# Patient Record
Sex: Female | Born: 1985 | Race: Black or African American | Hispanic: No | Marital: Single
Health system: Midwestern US, Community
[De-identification: ages and names within clinical notes are randomized; demographics above are authoritative.]

## PROBLEM LIST (undated history)

## (undated) DIAGNOSIS — D069 Carcinoma in situ of cervix, unspecified: Secondary | ICD-10-CM

## (undated) DIAGNOSIS — F32A Depression, unspecified: Secondary | ICD-10-CM

## (undated) DIAGNOSIS — Z9889 Other specified postprocedural states: Secondary | ICD-10-CM

## (undated) DIAGNOSIS — F329 Major depressive disorder, single episode, unspecified: Secondary | ICD-10-CM

## (undated) DIAGNOSIS — IMO0001 Reserved for inherently not codable concepts without codable children: Secondary | ICD-10-CM

## (undated) DIAGNOSIS — F419 Anxiety disorder, unspecified: Secondary | ICD-10-CM

## (undated) DIAGNOSIS — O039 Complete or unspecified spontaneous abortion without complication: Secondary | ICD-10-CM

## (undated) HISTORY — PX: CERVICAL CONE BIOPSY: SUR198

---

## 2008-01-22 LAB — STD PROFILE
Chlamydia amplified: NEGATIVE
HIV 1/2 Ab screen: NEGATIVE
Hep B surface Ag Interp.: NEGATIVE
Hep C virus Ab Interp.: NEGATIVE
Hepatitis B surface Ag: <0.1 Index (ref <1.00)
Hepatitis C virus Ab: 0.02 Index (ref <0.80)
N. gonorrhea, amplified: NEGATIVE
RPR: NONREACTIVE

## 2009-05-20 LAB — METABOLIC PANEL, BASIC
Anion gap: 9 mmol/L (ref 5–15)
BUN/Creatinine ratio: 10 — ABNORMAL LOW (ref 12–20)
BUN: 9 MG/DL (ref 7–18)
CO2: 25 MMOL/L (ref 21–32)
Calcium: 9.5 MG/DL (ref 8.4–10.4)
Chloride: 101 MMOL/L (ref 100–108)
Creatinine: 0.9 MG/DL (ref 0.6–1.3)
GFR est AA: >60 mL/min/{1.73_m2} (ref >60)
GFR est non-AA: >60 mL/min/{1.73_m2} (ref >60)
Glucose: 80 MG/DL (ref 74–99)
Potassium: 3.7 MMOL/L (ref 3.5–5.5)
Sodium: 135 MMOL/L — ABNORMAL LOW (ref 136–145)

## 2009-06-09 LAB — BETA HCG, QT
Beta HCG, QT: 422195 m[IU]/mL — ABNORMAL HIGH (ref 0–10)
hCG Quant: 422195 m[IU]/mL — ABNORMAL HIGH (ref 0–10)

## 2009-06-09 LAB — URINALYSIS W/ RFLX MICROSCOPIC
Bilirubin: NEGATIVE
Glucose: NEGATIVE MG/DL
Ketone: >80 MG/DL — AB
Leukocyte Esterase: NEGATIVE
Nitrites: NEGATIVE
Protein: 30 MG/DL — AB
Specific gravity: >1.03 — ABNORMAL HIGH (ref 1.003–1.030)
Urobilinogen: 1 EU/DL (ref 0.2–1.0)
pH (UA): 6 (ref 5.0–8.0)

## 2009-06-09 LAB — URINE MICROSCOPIC ONLY
RBC: 0 /HPF (ref 0–5)
WBC: NEGATIVE /HPF (ref 0–4)

## 2009-06-09 LAB — METABOLIC PANEL, BASIC
Anion gap: 13 mmol/L (ref 5–15)
BUN/Creatinine ratio: 13 (ref 12–20)
BUN: 10 MG/DL (ref 7–18)
CO2: 23 MMOL/L (ref 21–32)
Calcium: 9.6 MG/DL (ref 8.4–10.4)
Chloride: 98 MMOL/L — ABNORMAL LOW (ref 100–108)
Creatinine: 0.8 MG/DL (ref 0.6–1.3)
GFR est AA: >60 mL/min/{1.73_m2} (ref >60)
GFR est non-AA: >60 mL/min/{1.73_m2} (ref >60)
Glucose: 75 MG/DL (ref 74–99)
Potassium: 3.4 MMOL/L — ABNORMAL LOW (ref 3.5–5.5)
Sodium: 134 MMOL/L — ABNORMAL LOW (ref 136–145)

## 2009-06-09 LAB — CBC WITH AUTOMATED DIFF
ABS. BASOPHILS: 0 10*3/uL (ref 0.0–0.06)
ABS. EOSINOPHILS: 0.1 10*3/uL (ref 0.0–0.4)
ABS. LYMPHOCYTES: 1.4 10*3/uL (ref 0.9–3.6)
ABS. MONOCYTES: 0.3 10*3/uL (ref 0.05–1.2)
ABS. NEUTROPHILS: 5.5 10*3/uL (ref 1.8–8.0)
BASOPHILS: 1 % (ref 0–2)
EOSINOPHILS: 2 % (ref 0–5)
HCT: 35 % (ref 35.0–45.0)
HGB: 12.2 g/dL (ref 12.0–16.0)
LYMPHOCYTES: 18 % — ABNORMAL LOW (ref 21–52)
MCH: 31 PG (ref 24.0–34.0)
MCHC: 34.9 g/dL (ref 31.0–37.0)
MCV: 89.1 FL (ref 74.0–97.0)
MONOCYTES: 4 % (ref 3–10)
MPV: 9.5 FL (ref 9.2–11.8)
NEUTROPHILS: 75 % — ABNORMAL HIGH (ref 40–73)
PLATELET: 299 10*3/uL (ref 135–420)
RBC: 3.93 M/uL — ABNORMAL LOW (ref 4.20–5.30)
RDW: 11.8 % (ref 11.6–14.5)
WBC: 7.4 10*3/uL (ref 4.6–13.2)

## 2009-06-21 LAB — URINALYSIS W/ RFLX MICROSCOPIC
Bilirubin: NEGATIVE
Blood: NEGATIVE
Glucose: NEGATIVE MG/DL
Ketone: >80 MG/DL — AB
Leukocyte Esterase: NEGATIVE
Nitrites: NEGATIVE
Protein: 100 MG/DL — AB
Specific gravity: >1.03 — ABNORMAL HIGH (ref 1.003–1.030)
Urobilinogen: 0.2 EU/DL (ref 0.2–1.0)
pH (UA): 6 (ref 5.0–8.0)

## 2009-06-21 LAB — METABOLIC PANEL, BASIC
Anion gap: 13 mmol/L (ref 5–15)
BUN/Creatinine ratio: 13 (ref 12–20)
BUN: 12 MG/DL (ref 7–18)
CO2: 24 MMOL/L (ref 21–32)
Calcium: 9.7 MG/DL (ref 8.4–10.4)
Chloride: 101 MMOL/L (ref 100–108)
Creatinine: 0.9 MG/DL (ref 0.6–1.3)
GFR est AA: >60 mL/min/{1.73_m2} (ref >60)
GFR est non-AA: >60 mL/min/{1.73_m2} (ref >60)
Glucose: 74 MG/DL (ref 74–99)
Potassium: 3.5 MMOL/L (ref 3.5–5.5)
Sodium: 138 MMOL/L (ref 136–145)

## 2009-06-21 LAB — CBC WITH AUTOMATED DIFF
ABS. BASOPHILS: 0 10*3/uL (ref 0.0–0.06)
ABS. EOSINOPHILS: 0.2 10*3/uL (ref 0.0–0.4)
ABS. LYMPHOCYTES: 1.3 10*3/uL (ref 0.9–3.6)
ABS. MONOCYTES: 0.3 10*3/uL (ref 0.05–1.2)
ABS. NEUTROPHILS: 5.6 10*3/uL (ref 1.8–8.0)
BASOPHILS: 0 % (ref 0–2)
EOSINOPHILS: 2 % (ref 0–5)
HCT: 36.6 % (ref 35.0–45.0)
HGB: 12.6 g/dL (ref 12.0–16.0)
LYMPHOCYTES: 18 % — ABNORMAL LOW (ref 21–52)
MCH: 30.6 PG (ref 24.0–34.0)
MCHC: 34.4 g/dL (ref 31.0–37.0)
MCV: 88.8 FL (ref 74.0–97.0)
MONOCYTES: 4 % (ref 3–10)
MPV: 9.4 FL (ref 9.2–11.8)
NEUTROPHILS: 76 % — ABNORMAL HIGH (ref 40–73)
PLATELET: 294 10*3/uL (ref 135–420)
RBC: 4.12 M/uL — ABNORMAL LOW (ref 4.20–5.30)
RDW: 11.8 % (ref 11.6–14.5)
WBC: 7.4 10*3/uL (ref 4.6–13.2)

## 2009-06-21 LAB — URINE MICROSCOPIC ONLY
RBC: 0 /HPF (ref 0–5)
WBC: 0 /HPF (ref 0–4)

## 2009-07-16 LAB — POC CHEM8
Anion gap, POC: 16 (ref 10–20)
BUN, POC: 4 MG/DL — ABNORMAL LOW (ref 7–18)
CO2, POC: 21 MMOL/L (ref 19–24)
Calcium, ionized (POC): 1.19 MMOL/L (ref 1.12–1.32)
Chloride, POC: 105 MMOL/L (ref 100–108)
Creatinine, POC: 0.7 MG/DL (ref 0.6–1.3)
GFRAA, POC: >60 mL/min/{1.73_m2} (ref >60)
GFRNA, POC: >60 mL/min/{1.73_m2} (ref >60)
Glucose, POC: 65 MG/DL — ABNORMAL LOW (ref 74–106)
Hematocrit, POC: 29 % — ABNORMAL LOW (ref 36.0–46.0)
Hemoglobin, POC: 9.9 G/DL — ABNORMAL LOW (ref 12.0–16.0)
Potassium, POC: 3.6 MMOL/L (ref 3.5–5.5)
Sodium, POC: 139 MMOL/L (ref 136–145)

## 2009-07-16 LAB — URINE MICROSCOPIC ONLY
Bacteria: NEGATIVE /HPF
RBC: 0 /HPF (ref 0–5)
WBC: 0 /HPF (ref 0–4)

## 2009-07-16 LAB — URINALYSIS W/ RFLX MICROSCOPIC
Bilirubin: NEGATIVE
Blood: NEGATIVE
Glucose: NEGATIVE MG/DL
Ketone: >80 MG/DL — AB
Leukocyte Esterase: NEGATIVE
Nitrites: NEGATIVE
Protein: 30 MG/DL — AB
Specific gravity: >1.03 — ABNORMAL HIGH (ref 1.003–1.030)
Urobilinogen: 2 EU/DL — ABNORMAL HIGH (ref 0.2–1.0)
pH (UA): 6.5 (ref 5.0–8.0)

## 2009-07-16 LAB — GLUCOSE, POC: Glucose (POC): 79 mg/dL (ref 70–110)

## 2009-12-15 LAB — POC URINE MACROSCOPIC
Bilirubin (POC): NEGATIVE
Blood (POC): NEGATIVE
Glucose, urine (POC): NEGATIVE mg/dL
Glucose, urine (POC): NEGATIVE mg/dL
Ketones (POC): 40 mg/dL — AB
Nitrite (POC): NEGATIVE
Nitrite (POC): NEGATIVE
Protein (POC): 100 mg/dL — AB
Spec. gravity (POC): 1.02 (ref 1.003–1.030)
Spec. gravity (POC): 1.02 (ref 1.003–1.030)
Urobilinogen (POC): 8 EU/dL — ABNORMAL HIGH (ref 0.2–1.0)
pH, urine  (POC): 7.5 (ref 5.0–8.0)
pH, urine  (POC): 7.5 (ref 5.0–8.0)

## 2010-01-04 LAB — TYPE AND SCREEN
ABO/Rh: A POS
Antibody Screen: NEGATIVE

## 2010-01-04 LAB — CBC WITH AUTOMATED DIFF
ABS. BASOPHILS: 0 10*3/uL (ref 0.0–0.06)
ABS. EOSINOPHILS: 0.2 10*3/uL (ref 0.0–0.4)
ABS. LYMPHOCYTES: 1.5 10*3/uL (ref 0.9–3.6)
ABS. MONOCYTES: 1.1 10*3/uL (ref 0.05–1.2)
ABS. NEUTROPHILS: 8 10*3/uL (ref 1.8–8.0)
BASOPHILS: 0 % (ref 0–2)
EOSINOPHILS: 2 % (ref 0–5)
HCT: 27.8 % — ABNORMAL LOW (ref 35.0–45.0)
HGB: 9.1 g/dL — ABNORMAL LOW (ref 12.0–16.0)
LYMPHOCYTES: 14 % — ABNORMAL LOW (ref 21–52)
MCH: 28.8 PG (ref 24.0–34.0)
MCHC: 32.7 g/dL (ref 31.0–37.0)
MCV: 88 FL (ref 74.0–97.0)
MONOCYTES: 10 % (ref 3–10)
MPV: 10 FL (ref 9.2–11.8)
NEUTROPHILS: 74 % — ABNORMAL HIGH (ref 40–73)
PLATELET: 208 10*3/uL (ref 135–420)
RBC: 3.16 M/uL — ABNORMAL LOW (ref 4.20–5.30)
RDW: 13.8 % (ref 11.6–14.5)
WBC: 10.7 10*3/uL (ref 4.6–13.2)

## 2010-01-04 LAB — TYPE & SCREEN
ABO/Rh(D): A POS
Antibody screen: NEGATIVE

## 2010-01-05 LAB — HEMOGLOBIN: HGB: 8.9 g/dL — ABNORMAL LOW (ref 12.0–16.0)

## 2010-01-05 LAB — HEMATOCRIT: HCT: 27.4 % — ABNORMAL LOW (ref 35.0–45.0)

## 2010-04-18 LAB — URINALYSIS W/ RFLX MICROSCOPIC
Bilirubin: NEGATIVE
Glucose: NEGATIVE MG/DL
Ketone: NEGATIVE MG/DL
Leukocyte Esterase: NEGATIVE
Nitrites: NEGATIVE
Protein: NEGATIVE MG/DL
Specific gravity: >1.03 — ABNORMAL HIGH (ref 1.003–1.030)
Urobilinogen: 0.2 EU/DL (ref 0.2–1.0)
pH (UA): 5.5 (ref 5.0–8.0)

## 2010-04-18 LAB — HCG URINE, QL: HCG urine, QL: NEGATIVE

## 2010-04-18 LAB — URINE MICROSCOPIC ONLY
RBC: 0 /HPF (ref 0–5)
WBC: NEGATIVE /HPF (ref 0–4)

## 2010-04-19 LAB — CHLAMYDIA/GC PCR
Chlamydia amplified: NEGATIVE
N. gonorrhea, amplified: NEGATIVE

## 2010-04-27 LAB — HGB & HCT
HCT: 34.5 % — ABNORMAL LOW (ref 35.0–45.0)
HGB: 11.6 g/dL — ABNORMAL LOW (ref 12.0–16.0)

## 2010-04-28 LAB — TYPE AND SCREEN
ABO/Rh: A POS
Antibody Screen: NEGATIVE

## 2010-04-28 LAB — TYPE & SCREEN
ABO/Rh(D): A POS
Antibody screen: NEGATIVE

## 2010-05-01 NOTE — H&P (Unsigned)
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    HISTORY OF PRESENT ILLNESS: The patient is a 25 year old female, gravida 3,  para 2, AB 1, who was noted in June 2011 to have a Pap smear consistent  with high-grade squamous intraepithelial neoplasia. Colposcopy was  performed in August 2011, but no biopsy was obtained due to pregnancy. Pap  smear in January 2012 was interpreted as atypical squamous cells  of undetermined significance with high-grade features and a  colposcopic-directed biopsy in March 2012 revealed cervical intraepithelial  neoplasia grade 2. Management options were discussed with the patient and  LEEP colonization was agreed upon. Due to patient apprehension about an  office-based procedure, she is scheduled in the main operating room with  anesthesia.    PAST MEDICAL HISTORY: Otherwise, unremarkable. She has had 2 term  pregnancies with vaginal deliveries and 1 miscarriage.    SURGICAL HISTORY: Unremarkable.    REVIEW OF SYSTEMS: Noncontributory.    FAMILY MEDICAL HISTORY: Also, noncontributory.    PHYSICAL EXAMINATION  GENERAL: Well-developed and well-nourished female, in no acute distress.  VITAL SIGNS: Her blood pressure is 102/70. Weight is 134 pounds.  HEAD/EARS/EYES/NOSE/THROAT: Within normal limits.  LUNGS: Clear.  CARDIOVASCULAR: Regular rate and rhythm without S3, S4 or murmur.  ABDOMEN: Soft and nontender.  EXTREMITIES: Normal.  NEUROLOGICAL: Intact.  PELVIC: Normal female external genitalia. The vault is clear. The cervix   parous. The uterus is normal, size, shape and consistency. The adnexa are  normal.    IMPRESSION: Cervical intraepithelial neoplasia grade 2.    PLAN: Admit for LEEP colonization. The procedure was discussed in detail  the patient. She understands the indications for surgery. She also  understands and accepts the risks of surgery to include bleeding, possibly  requiring blood transfusion; infection; injury to the cervix, uterus and  other organs requiring further surgery to correct. She also understands  that despite the procedure, there may be a persistence or recurrence of the  disease process requiring further treatment. Opportunity was afforded for  questions, which were answered, and the patient consented to surgery as  discussed.                            Date:______Time:______Signature________________________________              Genoveva Ill, MD    HDW:wmx  D: 05/01/2010  6:27 P T: 05/01/2010  6:54 P  Job#:  [id number]  CScriptDoc #:  [id number]  cc:   Lejon Afzal DIXON Emrey Thornley,  MD

## 2010-05-02 NOTE — Op Note (Unsigned)
[facility]                  [address], [place], [place]  [address]                                 OPERATIVE REPORT    PATIENT:     Tiffany, Hernandez  MRN              [id number]   DATE:       05/02/2010  BILLING:         [id number]  LOCATION:  ATTENDING:   Genoveva Ill, MD  SURGEON:     Genoveva Ill, MD    AMENDED  PREOPERATIVE DIAGNOSES: Cervical intraepithelial neoplasia, grade 2.    POSTOPERATIVE DIAGNOSES: Cervical intraepithelial neoplasia, grade 2.    PROCEDURE PERFORMED: Loop electrode excision procedure conization.    SURGEON: Milessa Hogan Camillia Herter, MD.    FIRST ASSISTANT: Delice Bison.    ANESTHESIA: [place] [facility], general LMA.    The remainder of the intraoperative written summary has been reviewed and  is correct.    INDICATIONS: The patient is a 25 year old female, gravida 3, para 2, AB 1  with CIN 2 on colposcopic directed biopsy. Additional information  concerning indications for surgery is contained in the previously dictated  history and physical examination report.    DESCRIPTION OF PROCEDURE: In the operating room, adequate anesthesia was  achieved with a general LMA technique. The patient was carefully placed in  candy-cane stirrups in the usual manner for vaginal surgery. Care was taken  to avoid any undue flexion or points of pressure. A nonconducting speculum  was introduced into the vagina and the cervix was bathed with acetic acid.  The cervix was then infiltrated with 1% lidocaine 10 mL and then utilizing  operating loops and a head lamp to facilitate visualization, a LEEP  conization was performed utilizing a 20 mm loop. The excision was carried  from the 8 o'clock on the cervix to 2 o'clock on the cervix. The specimen  was removed in toto. It was later tagged at 9 o'clock and cut at 6 o'clock.  Spray fulguration was utilized for hemostasis of the cone bed and this was   then augmented with a ferric sulfate solution. The estimated blood loss was  minimal. Hemostasis was excellent at the completion of the case with the  nonconducting speculum was removed. The patient tolerated the procedure  well. Final sponge and needle count was correct. She went to the recovery  room in satisfactory condition.                             Date:______Time:______Signature________________________________                                     Genoveva Ill, MD    HDW:wmx  D: 05/02/2010  2:23 P T: 05/02/2010  3:02 P  Job#:  [id number]  CScriptDoc #:  [id number]  cc:   Genoveva Ill, MD

## 2010-05-03 LAB — HCG URINE, QL. - POC: Pregnancy test,urine (POC): NEGATIVE

## 2010-07-22 LAB — POC CHEM8
Anion gap, POC: 14 (ref 10–20)
BUN, POC: 9 MG/DL (ref 7–18)
CO2, POC: 23 MMOL/L (ref 19–24)
Calcium, ionized (POC): 1.26 MMOL/L (ref 1.12–1.32)
Chloride, POC: 104 MMOL/L (ref 100–108)
Creatinine, POC: 0.7 MG/DL (ref 0.6–1.3)
GFRAA, POC: >60 mL/min/{1.73_m2} (ref >60)
GFRNA, POC: >60 mL/min/{1.73_m2} (ref >60)
Glucose, POC: 73 MG/DL — ABNORMAL LOW (ref 74–106)
Hematocrit, POC: 38 % (ref 36.0–46.0)
Hemoglobin, POC: 12.9 G/DL (ref 12.0–16.0)
Potassium, POC: 3.7 MMOL/L (ref 3.5–5.5)
Sodium, POC: 137 MMOL/L (ref 136–145)

## 2010-07-22 LAB — URINALYSIS W/ RFLX MICROSCOPIC
Bilirubin UA, confirm: NEGATIVE
Bilirubin: NEGATIVE
Blood: NEGATIVE
Glucose: NEGATIVE MG/DL
Ketone: 40 MG/DL — AB
Nitrites: NEGATIVE
Specific gravity: 1.02 (ref 1.003–1.030)
Urobilinogen: 2 EU/DL — ABNORMAL HIGH (ref 0.2–1.0)
pH (UA): 7.5 (ref 5.0–8.0)

## 2010-07-22 LAB — URINE MICROSCOPIC ONLY
RBC: 0 /HPF (ref 0–5)
WBC: 0 /HPF (ref 0–4)

## 2010-07-22 LAB — HCG URINE, QL: HCG urine, QL: POSITIVE — AB

## 2010-09-15 LAB — BLOOD TYPE, (ABO+RH)
ABO,Rh: A POS
TYPE, ABO & RH, EXTERNAL: A POS

## 2010-09-15 LAB — RPR
RPR, EXTERNAL: NEGATIVE
RPR, External: NEGATIVE

## 2010-09-15 LAB — CHLAMYDIA DNA PROBE: Chlamydia, External: NEGATIVE

## 2010-09-15 LAB — RUBELLA AB, IGM: Rubella, External: IMMUNE

## 2010-09-15 LAB — N GONORRHOEAE, DNA PROBE: Gonorrhea, External: NEGATIVE

## 2010-09-15 LAB — HEP B SURFACE AG: HBsAg, External: NEGATIVE

## 2011-01-02 NOTE — L&D Delivery Note (Signed)
Delivery Summary    Patient: Tiffany Hernandez MRN: [id number]  SSN: [id number]    Date of Birth: 06-28-1985  Age: 26 y.o.  Sex: female        Labor Events:   Preterm Labor: No   Rupture Date: 03/02/2011   Rupture Time: 2:14 AM   Rupture Type AROM   Amniotic Fluid Volume: Moderate    Amniotic Fluid Description: Clear    Induction: None      Augmentation: AROM   Events: None     Cervical Ripening:   None     Delivery Events:  Episiotomy: None   Laceration(s): None    Repaired: None    Number of Repair Packets:    Suture Type and Size:    Estimated Blood Loss (ml): 250       Information for the patient's newborn:  Sheanna, Dail Girl 1 [[id number]]     Delivery Summary - Baby    Delivery Date: 03/02/2011   Delivery Time: 4:01 AM   Delivery Type: Spontaneous Vaginal Delivery   Sex:  female    Gestational Age: 2.3 weeks.  Delivery Clinician:  Emmit Alexanders  Living?: Yes  Delivery Location: L&D          APGARS  One minute Five minutes Ten minutes   Skin Color: 0   1       Heart Rate: 2   2       Reflex Irritability: 2   2       Muscle Tone: 2   2       Respiration: 2   2       Total: 8  9        Presentation: Vertex    Position: Left Occiput Anterior  Resuscitation Method:  Other (Comment) tactile stimulation   Meconium Stained: None    Cord Information: 3 Vessels      Cord Events: None      Cord Blood Sent?:  Yes    Blood Gases Sent?:  No    Placenta:  Date: 03/02/2011   Time: 4:09 AM  Removal:     Appearance: Normal     Newborn Measurements:  Birth Weight: 2.722 kg    Birth Length: 18.5"    Head Circumference:     Chest Circumference:     Abdominal Girth:      Other Providers:   Glennis D. Lequita Halt Nursery Nurse            Pt pushing well, called to delivery and head crowning, CNM delivered shoulders and body. Infant lusty cry immediately and lifted to maternal abdomen.  Cord clamped and cut after pulsating. Spontaneous delivery placenta intact via Tomasa Blase mechanism over intact perineum.  FF, EBL .  Both mother and  infant stable and recovering in birthing room.  Dr Durwin Nora notified of delivery.                                          Erskin Burnet, CNM

## 2011-02-02 LAB — GYN RAPID GP B STREP: GrBStrep, External: NEGATIVE

## 2011-03-01 ENCOUNTER — Inpatient Hospital Stay
Admit: 2011-03-01 | Discharge: 2011-03-04 | Disposition: A | Discharge status: Home / Self Care | Payer: BLUE CROSS/BLUE SHIELD | Attending: Obstetrics & Gynecology | Admitting: Obstetrics & Gynecology

## 2011-03-01 LAB — TYPE AND SCREEN
ABO/Rh: A POS
Antibody Screen: NEGATIVE

## 2011-03-01 LAB — CBC WITH AUTOMATED DIFF
ABS. BASOPHILS: 0 10*3/uL (ref 0.0–0.06)
ABS. EOSINOPHILS: 0.2 10*3/uL (ref 0.0–0.4)
ABS. LYMPHOCYTES: 1.6 10*3/uL (ref 0.9–3.6)
ABS. MONOCYTES: 1 10*3/uL (ref 0.05–1.2)
ABS. NEUTROPHILS: 8.5 10*3/uL — ABNORMAL HIGH (ref 1.8–8.0)
BASOPHILS: 0 % (ref 0–2)
EOSINOPHILS: 1 % (ref 0–5)
HCT: 26.5 % — ABNORMAL LOW (ref 35.0–45.0)
HGB: 8.3 g/dL — ABNORMAL LOW (ref 12.0–16.0)
LYMPHOCYTES: 14 % — ABNORMAL LOW (ref 21–52)
MCH: 27.7 PG (ref 24.0–34.0)
MCHC: 31.3 g/dL (ref 31.0–37.0)
MCV: 88.3 FL (ref 74.0–97.0)
MONOCYTES: 9 % (ref 3–10)
MPV: 9.1 FL — ABNORMAL LOW (ref 9.2–11.8)
NEUTROPHILS: 76 % — ABNORMAL HIGH (ref 40–73)
PLATELET: 229 10*3/uL (ref 135–420)
RBC: 3 M/uL — ABNORMAL LOW (ref 4.20–5.30)
RDW: 13.9 % (ref 11.6–14.5)
WBC: 11.2 10*3/uL (ref 4.6–13.2)

## 2011-03-01 LAB — TYPE & SCREEN
ABO/Rh(D): A POS
Antibody screen: NEGATIVE

## 2011-03-01 MED ADMIN — promethazine (PHENERGAN) injection 25 mg: INTRAVENOUS | @ 22:00:00 | NDC 00641092821

## 2011-03-01 MED ADMIN — lactated ringers infusion: INTRAVENOUS | @ 21:00:00 | NDC 00409795309

## 2011-03-01 MED ADMIN — HYDROmorphone (PF) (DILAUDID) injection 1 mg: INTRAVENOUS | @ 21:00:00 | NDC 00409128331

## 2011-03-01 MED ADMIN — lactated ringers bolus infusion 500 mL: INTRAVENOUS | @ 21:00:00 | NDC 00409795309

## 2011-03-01 MED ADMIN — meperidine (DEMEROL) injection 25 mg: INTRAVENOUS | @ 22:00:00 | NDC 00409117830

## 2011-03-01 MED FILL — DEMEROL (PF) 50 MG/ML INJECTION SYRINGE: 50 mg/mL | INTRAMUSCULAR | Qty: 1

## 2011-03-01 MED FILL — LACTATED RINGERS IV: INTRAVENOUS | Qty: 1000

## 2011-03-01 MED FILL — HYDROMORPHONE (PF) 1 MG/ML IJ SOLN: 1 mg/mL | INTRAMUSCULAR | Qty: 1

## 2011-03-01 MED FILL — PROMETHAZINE 25 MG/ML INJECTION: 25 mg/mL | INTRAMUSCULAR | Qty: 1

## 2011-03-01 NOTE — Progress Notes (Signed)
Tiffany Hernandez, is a G3P2, [redacted] weeks gestation reported to L&D with painful contractions that started this morning at 3 am, patient had a HDG visit yesterday and membranes were swept and she reports that she was 4 cm then.  Patient denies symptoms of PIH.   Patient denies leakage of fluid or bleeding from her vagina.  Patient does report some blood tinged mucus.  EFM and TOCO applied.  Patient reports level 7 pain with contractions and wants epidural for pain management.

## 2011-03-01 NOTE — Progress Notes (Signed)
Dr Dixon updated on pt status.

## 2011-03-01 NOTE — Progress Notes (Signed)
[facility]  [address]  [address]  [place], [place] [address]  [phone]     History & Physical    Name: Tiffany Hernandez MRN: [id number]  SSN: [id number]    Date of Birth: 1985/02/15  Age: 26 y.o.         Tiffany Hernandez is a 26 y.o. G3 P52 female who is [redacted]w[redacted]d by LNMP c/w 1st trimester sono, who presents with c/o UCs since 0330 this AM.  States they are not getting any better.  Denies bleeding or leaking of fluid from vagina.  RN VE on arrival at 1430 4/60/-2, VE by CNM 1704 4/80/-1, IBOW.  Reviewed with pt she has not changed cervix, offered her to go home, ambien, or IV therapeutic rest and if no cervical change after IV therapeutic rest, then she will be discharged home, she verbalizes understanding and would like to try IV therapeutic rest.  Outpatient to 3418/01.    Current Pregnancy: started [facility] @ 27 weeks; 1hr gtt 100;        OB hx:   OB History     Grav Para Term Preterm Abortions TAB SAB Ect Mult Living    3 2 2       2           Gyn hx:  Abnormal pap smear requiring LEEP during postpartum period; hx of STIs    Med Surg hx: No Known Allergies  Prior to Admission medications    Medication Sig Start Date End Date Taking? Authorizing Provider   acetaminophen (TYLENOL) 325 mg tablet Take 650 mg by mouth every four (4) hours as needed. Indications: PAIN   Yes Historical Provider       No past medical history on file.   Past Surgical History   Procedure Date   ??? Hx other surgical      leep april 2012 [place]       Family hx: No family history on file.    Social hx:    History     Social History   ??? Marital Status: SINGLE     Spouse Name: N/A     Number of Children: N/A   ??? Years of Education: N/A     Occupational History   ??? Not on file.     Social History Main Topics   ??? Smoking status: Never Smoker    ??? Smokeless tobacco: Never Used   ??? Alcohol Use: No   ??? Drug Use: No   ??? Sexually Active: Yes -- Female partner(s)     Birth Control/ Protection: None     Other Topics Concern   ??? Not on file      Social History Narrative   ??? No narrative on file       O:     Visit Vitals   Item Reading   ??? BP 123/72   ??? Pulse 68   ??? Temp 98.2 ??F (36.8 ??C)   ??? Resp 18   ??? Ht 5\' 7"  (1.702 m)   ??? Wt 65.772 kg (145 lb)   ??? BMI 22.71 kg/m2   ??? SpO2 99%   ??? Breastfeeding Unknown          Fetal Heart Rate:   FrequencyFetal heart variability: moderate  Fetal Heart Rate decelerations: none  Fetal Heart Rate accelerations: yes  Baseline FHR: 140     Uterine Activity:  Uterine contractions: regular, every 7-10 minutes, 60-80sec, mild  Amniotic Fluid: IBOW  CervicalExam:4  (  Brittany Amirault, CNM)/80 %/-1/Posterior Vertex    Physical Exam:  Patient without distress  HEENT: normal  Neurologically: intact  Chest: clear to auscultation and percussion  Abdomen: benign and consistent with her dates  Extremities: with some swelling but not hyperreflexia    Labs:   Lab Results   Component Value Date/Time    ABO/Rh(D) A POS 03/01/2011  4:00 PM         A: IUP @ [redacted]w[redacted]d      GBS: negative      P: IV therapeutic rest       Reevaluate prn      Dr Kyung Rudd, MD  notified and aware of pt        Signed By:  Emmit Alexanders, CNM     March 01, 2011

## 2011-03-01 NOTE — Progress Notes (Signed)
[facility]  [address]  [address]  [place], [place] [address]  [phone]    Discharged [id number], she is refusing to go home, wants recheck due to painful UCs and hx of fast labors.  VE 5/90/0, BBOW.  Disc she can go home or stay the night, but she must get out of bed and ambulate or get in shower, no drugs due to fetus still under affect of IV drugs.  She would like to stay, helped into shower.  Pt will remain under observation status in 3418.       VS:   Patient Vitals for the past 8 hrs:   BP Temp Pulse Resp SpO2 Height Weight   03/01/11 2013 - 97.9 ??F (36.6 ??C) - - - - -   03/01/11 2012 122/76 mmHg - 65  - - - -   03/01/11 1828 - - - - 88 % - -   03/01/11 1826 - - - - 92 % - -   03/01/11 1825 - - - - 95 % - -   03/01/11 1824 - - - - 95 % - -   03/01/11 1823 - - - - 84 % - -   03/01/11 1821 - - - - 91 % - -   03/01/11 1818 - - - - 91 % - -   03/01/11 1815 - - - - 92 % - -   03/01/11 1813 - - - - 91 % - -   03/01/11 1810 - - - - 91 % - -   03/01/11 1808 - - - - 91 % - -   03/01/11 1805 - - - - 91 % - -   03/01/11 1803 - - - - 91 % - -   03/01/11 1800 - - - - 91 % - -   03/01/11 1757 - - - - 91 % - -   03/01/11 1755 - - - - 91 % - -   03/01/11 1752 - - - - 91 % - -   03/01/11 1750 - - - - 91 % - -   03/01/11 1747 - - - - 91 % - -   03/01/11 1745 - - - - 91 % - -   03/01/11 1742 - - - - 91 % - -   03/01/11 1740 - - - - 91 % - -   03/01/11 1737 - - - - 92 % - -   03/01/11 1736 - - - - 93 % - -   03/01/11 1735 - - - - 92 % - -   03/01/11 1733 - - - - 92 % - -   03/01/11 1732 - - - - 92 % - -   03/01/11 1730 - - - - 90 % - -   03/01/11 1729 - - - - 91 % - -   03/01/11 1726 - - - - 95 % - -   03/01/11 1723 - - - - 91 % - -   03/01/11 1721 - - - - 93 % - -   03/01/11 1720 - - - - 87 % - -   03/01/11 1717 - - - - 91 % - -   03/01/11 1715 - - - - 93 % - -   03/01/11 1712 - - - - 84 % - -   03/01/11 1711 - - - - 92 % - -   03/01/11 1710 - - - - 93 % - -  03/01/11 1707 - - - - 98 % - -   03/01/11 1649 - - - - 99 % -  -   03/01/11 1648 - - - - 84 % - -   03/01/11 1636 - - - - 99 % - -   03/01/11 1631 - - - - 98 % - -   03/01/11 1629 - - - - 99 % - -   03/01/11 1624 - - - - 99 % - -   03/01/11 1621 - - - - 99 % - -   03/01/11 1616 - - - - 99 % - -   03/01/11 1613 - - - 18  99 % - -   03/01/11 1602 - - - - - 5\' 7"  (1.702 m) 65.772 kg (145 lb)   03/01/11 1523 - - - - 99 % - -   03/01/11 1517 - - - - 98 % - -   03/01/11 1513 - - - - 71 % - -   03/01/11 1511 - - - - 78 % - -   03/01/11 1508 - - - - 96 % - -   03/01/11 1503 - - - - 99 % - -   03/01/11 1457 - - - - 99 % - -   03/01/11 1452 - - - - 99 % - -   03/01/11 1447 - - - - 99 % - -   03/01/11 1442 - - - - 99 % - -   03/01/11 1437 - - - - 99 % - -   03/01/11 1432 - - - - 99 % - -   03/01/11 1427 123/72 mmHg - 68  - 71 % - -   03/01/11 1425 - - - - 81 % - -   03/01/11 1422 - - - - 99 % - -   03/01/11 1420 121/68 mmHg 98.2 ??F (36.8 ??C) 69  20  90 % - -     Temp (24hrs), Avg:98.1 ??F (36.7 ??C), Min:97.9 ??F (36.6 ??C), Max:98.2 ??F (36.8 ??C)      FHT: Patient Vitals for the past 2 hrs:   Mode Fetal Heart Rate Fetal Activity Variability Decelerations Accelerations   03/01/11 2000 External 120  Present 6-25 BPM None No        Uterine Activity:  Uterine contractions: regular, every 4-7 minutes, 60-80sec, mild  Amniotic Fluid: BBOW  CervicalExam:5 /90 %/0/Posterior Vertex     A: IUP@ [redacted]w[redacted]d      GBS: negative    P: Ambulate/position changes      Reevaluate prn      Dr. Kyung Rudd, MD notified of pt status                                 Signed By:  Emmit Alexanders, CNM     March 01, 2011

## 2011-03-02 MED ADMIN — fentaNYL citrate (PF) injection 100 mcg: INTRAVENOUS | @ 05:00:00 | NDC 00409127632

## 2011-03-02 MED ADMIN — oxytocin (PITOCIN) 20 units/1000 ml LR: INTRAVENOUS | @ 09:00:00 | NDC 99990002896

## 2011-03-02 MED ADMIN — ibuprofen (MOTRIN) tablet 800 mg: ORAL | @ 15:00:00 | NDC 62584074611

## 2011-03-02 MED ADMIN — fentaNYL-ropivacaine in NS (PF) 1 mcg/ml-0.2% epidural: EPIDURAL | @ 05:00:00 | NDC 99999174210

## 2011-03-02 MED ADMIN — lactated ringers bolus infusion 1,000 mL: INTRAVENOUS | @ 05:00:00 | NDC 00409795309

## 2011-03-02 MED ADMIN — senna-docusate (PERICOLACE) 8.6-50 mg per tablet 1 Tab: ORAL | @ 15:00:00 | NDC 68084005011

## 2011-03-02 MED ADMIN — lactated ringers infusion: INTRAVENOUS | @ 05:00:00 | NDC 00409795309

## 2011-03-02 MED ADMIN — diph,Pertuss(AC),Tet Vac-PF (BOOSTRIX) suspension 0.5 mL: INTRAMUSCULAR | @ 16:00:00 | NDC 58160084243

## 2011-03-02 MED FILL — MODIFIED LANOLIN CREAM: CUTANEOUS | Qty: 7

## 2011-03-02 MED FILL — LACTATED RINGERS IV: INTRAVENOUS | Qty: 1000

## 2011-03-02 MED FILL — M-M-R II (PF) 1,000-12,500 TCID50/0.5 ML SUBCUTANEOUS SOLUTION: 1000-12500 TCID50/0.5 mL | SUBCUTANEOUS | Qty: 1

## 2011-03-02 MED FILL — LIDOCAINE (PF) 10 MG/ML (1 %) IJ SOLN: 10 mg/mL (1 %) | INTRAMUSCULAR | Qty: 20

## 2011-03-02 MED FILL — FLUARIX 2012-2013(PF) 45 MCG (15 MCG X 3)/0.5 ML INTRAMUSCULAR SYRINGE: 45 mcg (15 mcg x 3)/0.5 mL | INTRAMUSCULAR | Qty: 0.5

## 2011-03-02 MED FILL — SENNA PLUS 8.6 MG-50 MG TABLET: ORAL | Qty: 1

## 2011-03-02 MED FILL — IBUPROFEN 400 MG TAB: 400 mg | ORAL | Qty: 2

## 2011-03-02 MED FILL — FENTANYL-ROPIVACAINE IN NS (PF) 1 MCG/ML-0.2% EPIDURAL: EPIDURAL | Qty: 100

## 2011-03-02 MED FILL — FENTANYL CITRATE (PF) 50 MCG/ML IJ SOLN: 50 mcg/mL | INTRAMUSCULAR | Qty: 2

## 2011-03-02 MED FILL — BOOSTRIX TDAP 2.5 LF UNIT-8 MCG-5 LF/0.5 ML INTRAMUSCULAR SUSPENSION: INTRAMUSCULAR | Qty: 0.5

## 2011-03-02 MED FILL — DERMOPLAST (WITH MENTHOL) 20 %-0.5 % TOPICAL AEROSOL: CUTANEOUS | Qty: 56

## 2011-03-02 NOTE — Progress Notes (Signed)
Post-Partum Day Number 0 Progress Note    Patient doing well post-partum without significant complaint.  Voiding without difficulty, normal lochia. Breast feeding well.       Breastfeeding: yes    Vitals:  Patient Vitals for the past 8 hrs:   BP Temp Pulse Resp SpO2   03/02/11 0825 118/67 mmHg 99.2 ??F (37.3 ??C) 80  20  100 %   03/02/11 0557 142/67 mmHg - 82  - -   03/02/11 0542 117/75 mmHg - 75  - -   03/02/11 0527 130/74 mmHg - 87  - -   03/02/11 0511 138/77 mmHg - 74  - -   03/02/11 0456 133/76 mmHg - 85  - -   03/02/11 0442 127/62 mmHg - 95  - -   03/02/11 0426 132/83 mmHg - 102  - -   03/02/11 0412 135/83 mmHg - 104  - -   03/02/11 0402 - - - - 99 %   03/02/11 0357 127/90 mmHg - - - 99 %   03/02/11 0350 - - - - 99 %   03/02/11 0345 - - - - 99 %   03/02/11 0341 136/84 mmHg - 92  - 99 %   03/02/11 0336 - - - - 99 %   03/02/11 0330 - - - - 99 %   03/02/11 0329 - - - - 88 %   03/02/11 0327 113/45 mmHg - 84  - 99 %   03/02/11 0324 - - - - 99 %   03/02/11 0311 127/75 mmHg - 89  - -   03/02/11 0111 138/66 mmHg - 77  - -   03/02/11 0107 - - - - 99 %   03/02/11 0057 - - - - 98 %   03/02/11 0056 130/66 mmHg - 75  - -   03/02/11 0055 136/71 mmHg - 77  - -   03/02/11 0053 130/58 mmHg - 76  - -     Temp (24hrs), Avg:98.4 ??F (36.9 ??C), Min:97.9 ??F (36.6 ??C), Max:99.2 ??F (37.3 ??C)      Vital signs stable, afebrile.    Exam:  Patient is in good general condition.  Emotionally: appears to be stable  Fundus:  Firm and not tender.    Lower extremities are negative for swelling, cords or tenderness.    Lab/Data Review:  Labs reviewed    Assessment and Plan:  Patient appears to be having uncomplicated post-partum course.  Continue routine perineal care and maternal education.  Plan discharge tomorrow if no problems occur.    Education:  Will discuss discharge instructions tomorrow.  Encouraged rest today.            Antonietta Barcelona, CNM  March 02, 2011

## 2011-03-02 NOTE — Progress Notes (Signed)
Mom states baby latched and nursed well this a.m.  Experienced mom, breast fed other daughter.  Reviewed nursing pattern, wet/dirty diapers, milk coming in.  No questions/problems.  Info sheet, nutrition info given.  Encouraged to call as needed.

## 2011-03-02 NOTE — Other (Signed)
TRANSFER - OUT REPORT:    Verbal report given to C. West-Griggs LPN (name) on Tiffany Hernandez  being transferred to MB(unit) for routine progression of care       Report consisted of patient???s Situation, Background, Assessment and   Recommendations(SBAR).     Information from the following report(s) SBAR and Kardex was reviewed with the receiving nurse.    Opportunity for questions and clarification was provided.

## 2011-03-02 NOTE — Other (Signed)
Verbal shift change report given to EMLEE C FUSON, RN (oncoming nurse) by C. Westfall-Griggs, LPN (offgoing nurse).  Report given with SBAR, Kardex, Procedure Summary, Intake/Output, MAR and Recent Results.

## 2011-03-02 NOTE — Progress Notes (Signed)
[facility]  [address]  [address]  [place], [place] [address]  [phone]    Labor Progress Note    Tiffany Hernandez progressed on her own, received epidural, VE after epidural by RN 8/100/0.  Pt comfortable, VE by CNM 9/100/0 AROMd clear fluid @ 0214, pt wanted to start pushing, VE 10/100/0.  Pushing spontaneously with descent to +1, will let pt labor down.      VS:   Patient Vitals for the past 8 hrs:   BP Temp Pulse SpO2   03/02/11 0111 138/66 mmHg - 77  -   03/02/11 0107 - - - 99 %   03/02/11 0057 - - - 98 %   03/02/11 0056 130/66 mmHg - 75  -   03/02/11 0055 136/71 mmHg - 77  -   03/02/11 0053 130/58 mmHg - 76  -   03/02/11 0052 - - - 99 %   03/02/11 0051 157/93 mmHg - 79  -   03/02/11 0049 128/67 mmHg - 77  -   03/02/11 0047 126/64 mmHg - 81  97 %   03/02/11 0045 133/68 mmHg - 77  99 %   03/02/11 0043 138/71 mmHg - 78  -   03/02/11 0041 131/76 mmHg - 78  -   03/02/11 0040 - - - 99 %   03/02/11 0039 129/71 mmHg - 81  -   03/02/11 0037 132/61 mmHg - 72  -   03/02/11 0035 135/75 mmHg - 83  99 %   03/02/11 0033 129/57 mmHg - 86  -   03/02/11 0031 129/67 mmHg - 83  -   03/02/11 0029 134/64 mmHg - 75  99 %   03/02/11 0027 128/72 mmHg - 79  -   03/02/11 0025 131/62 mmHg - 76  -   03/02/11 0024 - - - 99 %   03/02/11 0023 126/82 mmHg - 82  -   03/02/11 0021 125/69 mmHg - 86  -   03/02/11 0019 122/64 mmHg - 86  99 %   03/02/11 0017 134/70 mmHg - 82  99 %   03/02/11 0015 129/69 mmHg - 83  -   03/02/11 0013 128/74 mmHg - 86  -   03/02/11 0012 - - - 99 %   03/02/11 0011 123/61 mmHg - 79  -   03/02/11 0009 122/78 mmHg - 94  -   03/02/11 0006 - - - 99 %   03/01/11 2013 - 97.9 ??F (36.6 ??C) - -   03/01/11 2012 122/76 mmHg - 65  -     Temp (24hrs), Avg:98.1 ??F (36.7 ??C), Min:97.9 ??F (36.6 ??C), Max:98.2 ??F (36.8 ??C)      FHT: Patient Vitals for the past 2 hrs:   Fetal Heart Rate   03/02/11 0247 125    03/02/11 0238 124    03/02/11 0223 132    03/02/11 0220 140    03/02/11 0216 138         Uterine Activity:  Uterine contractions:  regular, every 3-5 minutes, 60-80sec, moderate  Amniotic Fluid: clear  CervicalExam:10 /100 %/+1/Posterior Vertex     A: IUP@ [redacted]w[redacted]d      GBS: negative    P: Will resume pushing in approx , position change      Anticipate SVD      Dr. Kyung Rudd, MD notified of pt status  Signed By:  Emmit Alexanders, CNM     March 02, 2011

## 2011-03-02 NOTE — Progress Notes (Signed)
Epidural Catheter Placement    Operator: a Teaching laboratory technician      Patient sitting for CLE. Sterile prepped and draped  1% lidocaine to skin.    #18g Tuohy needle inserted at  Level L3 - L4 advanced to LOR. Number of attempts 1.   Neg heme,csf,parathesia. Negative test dose with  2 ml of 1.5% lidocaine w/1:200,000 epinephrine.  Load catheter with Fentanyl 100 mcg and 8 cc of 0.2% ropivicaine. + Level. Infusion started at 10 cc/hr.          Visit Vitals   Item Reading   ??? BP 122/76   ??? Pulse 65   ??? Temp 97.9 ??F (36.6 ??C)   ??? Resp 18   ??? Ht 5\' 7"  (1.702 m)   ??? Wt 65.772 kg (145 lb)   ??? BMI 22.71 kg/m2   ??? SpO2 88%   ??? Breastfeeding Unknown

## 2011-03-02 NOTE — Progress Notes (Signed)
Chaplain met with Patient Tiffany Hernandez completed the initial Spiritual Assessment of the patient, and offered Pastoral Care, see flow sheets for interventions. Baby Name Announcement was made with mother's permission([place]). Chart reviewed. Patient does not have any religious/cultural needs that will affect patient???s preferences in health care. Chaplains will continue to follow and will provide pastoral care on an as needed/requested basis.      Chaplain Briscoe Deutscher, MDiv,   Board Certified Chaplain  [phone] - Office

## 2011-03-02 NOTE — Progress Notes (Signed)
Cheney CNM aware pt status. States she will come see pt shortly.

## 2011-03-02 NOTE — Other (Signed)
TRANSFER - IN REPORT:    Verbal report received from Jessia Copeland RN(name) on Marcell R Aird  being received from L&D(unit) for routine progression of care      Report consisted of patient???s Situation, Background, Assessment and   Recommendations(SBAR).     Information from the following report(s) SBAR, Kardex, Intake/Output and MAR was reviewed with the receiving nurse.    Opportunity for questions and clarification was provided.      Assessment completed upon patient???s arrival to unit and care assumed.

## 2011-03-03 LAB — HEMOGLOBIN: HGB: 8.3 g/dL — ABNORMAL LOW (ref 12.0–16.0)

## 2011-03-03 LAB — HEMATOCRIT: HCT: 25.9 % — ABNORMAL LOW (ref 35.0–45.0)

## 2011-03-03 MED ADMIN — bisacodyl (DULCOLAX) suppository 10 mg: RECTAL | @ 17:00:00 | NDC 00713010906

## 2011-03-03 MED ADMIN — senna-docusate (PERICOLACE) 8.6-50 mg per tablet 1 Tab: ORAL | @ 02:00:00 | NDC 68084005011

## 2011-03-03 MED ADMIN — ibuprofen (MOTRIN) tablet 800 mg: ORAL | @ 04:00:00 | NDC 62584074611

## 2011-03-03 MED ADMIN — ibuprofen (MOTRIN) tablet 800 mg: ORAL | @ 22:00:00 | NDC 62584074611

## 2011-03-03 MED ADMIN — ibuprofen (MOTRIN) tablet 800 mg: ORAL | @ 15:00:00 | NDC 62584074611

## 2011-03-03 MED FILL — IBUPROFEN 400 MG TAB: 400 mg | ORAL | Qty: 2

## 2011-03-03 MED FILL — SENNA PLUS 8.6 MG-50 MG TABLET: ORAL | Qty: 1

## 2011-03-03 MED FILL — BISAC-EVAC 10 MG RECTAL SUPPOSITORY: 10 mg | RECTAL | Qty: 1

## 2011-03-03 NOTE — Other (Signed)
Verbal shift change report given to Tana Conch, RN (oncoming nurse) by Lauralee Evener  (offgoing nurse).  Report given with SBAR.

## 2011-03-03 NOTE — Progress Notes (Signed)
Chaplain visited new mother. With her permission, chaplain announced arrival of her newborn.     Chaplain Robyn Ruth  Board Certified Chaplain   [facility]   [phone]

## 2011-03-03 NOTE — Progress Notes (Signed)
Patient had bowel movement after suppository. Encouraged fluids and foods high in fiber when d/c'd.

## 2011-03-03 NOTE — Other (Signed)
Verbal shift change report given to EMLEE C FUSON, RN (oncoming nurse) by S. Davis, RN (offgoing nurse).  Report given with SBAR, Kardex, Procedure Summary, Intake/Output, MAR and Recent Results.

## 2011-03-03 NOTE — Progress Notes (Signed)
Patient concerned that baby has not had any wet diapers. Newborn last wet diaper was documented at 0100 this morning. Nursery nurse aware-Supplied patient with formula in addition to breast feeding infant. Will continue to monitor newborn diapers.

## 2011-03-03 NOTE — Progress Notes (Signed)
PPD # 1    Patient doing well post-partum without significant complaint.  Voiding without difficulty, normal lochia. Breastfeeding well. Baby stable.    Vitals:  Patient Vitals for the past 8 hrs:   BP Temp Pulse Resp   03/03/11 0800 112/61 mmHg 98.2 ??F (36.8 ??C) 64  18      Temp (24hrs), Avg:98 ??F (36.7 ??C), Min:97.9 ??F (36.6 ??C), Max:98.2 ??F (36.8 ??C)      Vital signs stable, afebrile.    Exam:  Patient without distress.   Breasts intact and nontender               Abdomen soft, fundus firm at level of umbilicus, nontender               Perineum with normal lochia noted.               Lower extremities are negative for swelling, cords or tenderness.    Lab/Data Review:  CBC:   Lab Results   Component Value Date/Time    HGB 8.3* 03/03/2011  6:09 AM    HCT 25.9* 03/03/2011  6:09 AM         Assessment and Plan:  Patient appears to be having uncomplicated post-partum course with exception of anemia.  Continue routine perineal care and maternal education.  Plan discharge tomorrow if no problems occur.    Amara Justen A. Clarisa Fling, CNM  03/03/2011  10:21 AM

## 2011-03-03 NOTE — Progress Notes (Signed)
Newborn has wet diaper at 1600

## 2011-03-04 MED ORDER — IBUPROFEN 800 MG TAB
800 mg | ORAL_TABLET | Freq: Three times a day (TID) | ORAL | Status: AC | PRN
Start: 2011-03-04 — End: 2011-05-03

## 2011-03-04 MED ORDER — IRON (BISGLYCINATE,PSCPLX)-DOCUSATE-C-FA-14MV 200 MG-50 MG-1 MG TABLET
200-50-1 mg | ORAL_TABLET | Freq: Every day | ORAL | Status: AC
Start: 2011-03-04 — End: 2011-05-03

## 2011-03-04 NOTE — Other (Signed)
Verbal shift change report given to Tollefson RN (oncoming nurse) by Fuson RN (offgoing nurse).  Report given with SBAR, Procedure Summary, Intake/Output and MAR.

## 2011-03-04 NOTE — Discharge Summary (Signed)
Obstetrical Discharge Summary     Name: Tiffany Hernandez MRN: [id number]  SSN: [id number]    Date of Birth: [birth year]  Age: 26 y.o.  Sex: female      Admit Date: 03/01/2011    Discharge Date: 03/04/2011     Admitting Physician: Kyung Rudd, MD     Attending Physician:  Kyung Rudd, MD     Admission Diagnoses: maternity  NSVD (normal spontaneous vaginal delivery)    Discharge Diagnoses:   Information for the patient's newborn:  Sarahmarie, Leavey Girl 1 [[id number]]   Delivery of a 2.722 kg female infant via Spontaneous Vaginal Delivery  on 03/02/2011 at 4:01 AM  by Emmit Alexanders. Apgars were 10 and 9.       Baby procedures:    Information for the patient's newborn:  Lailee, Hoelzel Girl 1 [[id number]]          Vital signs :   Visit Vitals   Item Reading   ??? BP 115/59   ??? Pulse 70   ??? Temp 97.9 ??F (36.6 ??C)   ??? Resp 18   ??? Ht 5\' 7"  (1.702 m)   ??? Wt 65.772 kg (145 lb)   ??? BMI 22.71 kg/m2   ??? SpO2 100%   ??? Breastfeeding Unknown     Labs:      Component Value Date/Time   WBC 11.2 03/01/2011  4:00 PM   Hemoglobin (POC) 12.9 07/22/2010  2:27 PM   HGB 8.3 03/03/2011  6:09 AM   Hematocrit (POC) 38 07/22/2010  2:27 PM   HCT 25.9 03/03/2011  6:09 AM   PLATELET 229 03/01/2011  4:00 PM   MCV 88.3 03/01/2011  4:00 PM       Patient Instructions:   Current Discharge Medication List      START taking these medications    Details   ibuprofen (MOTRIN) 800 mg tablet Take 1 Tab by mouth every eight (8) hours as needed for Pain (Pain scale 4-6) for 60 days.  Qty: 60 Tab, Refills: 1      Iron,salt mix,-Docus-C-FA-14MV 200-50-1 mg Tab Take 1 Tab by mouth daily for 60 days.  Qty: 30 Tab, Refills: 1         CONTINUE these medications which have NOT CHANGED    Details   acetaminophen (TYLENOL) 325 mg tablet Take 650 mg by mouth every four (4) hours as needed. Indications: PAIN             Reference my discharge instructions.    Follow-up Appointments   Procedures   ??? FOLLOW UP VISIT Appointment in: 6 Weeks     Standing Status: Standing      Number of  Occurrences: 1      Standing Expiration Date:      Order Specific Question:  Appointment in     Answer:  6 Weeks        Signed By:  Desma Mcgregor. Clarisa Fling, CNM     March 04, 2011

## 2011-03-04 NOTE — Progress Notes (Addendum)
~~   0800 ASSUME CARE OF PT SITTING IN BED EATING REG DIET, SR UP X2, CB IN REACH, FRIEND IN-- PT DENIES PAIN, NAUSEA, OR DIFF VOIDING. WATER MUG FILLED- PT OFFERS NO C/O OR REQ. PT READY TO GO HOME.    ~~ 1000 PT BREASTFEEDING WELL-  NO NEEDS EXPRESSED.    ~~ 1115 D/C TEACHING DONE, SAMPLES GIVEN, Rx GIVEN X2--  PT RECEPTIVE TO ALL INFO. FEW ?S ANSWERED. PT TOLD TO CALL WHEN READY FOR HUGS TO BE REMOVED.    ~~ 1215 HUGS REMOVED. FOOTPRINT SHEET & BRACELET CONFIRMED & SIGNED. BABY INTO CAR SEAT BY PT. PT D/C'D HOME VIA W/C IN [facility] [place]D W/O C/O OR REQ VOICED.

## 2011-05-10 LAB — RPR
RPR: NONREACTIVE
RPR: NONREACTIVE

## 2011-05-11 LAB — HIV 1/2 AB SCREEN W RFLX CONFIRM
HIV 1/2 Interpretation: NONREACTIVE
HIV1/2 INTERPRETATION, HHIVI: NONREACTIVE

## 2011-05-11 LAB — HEPATITIS C ANTIBODY
HCV Ab: 0.11 Index (ref <0.80)
Hepatitis C Ab: NEGATIVE

## 2011-05-11 LAB — HEPATITIS C AB
Hep C virus Ab Interp.: NEGATIVE
Hepatitis C virus Ab: 0.11 Index (ref <0.80)

## 2011-05-11 LAB — CHLAMYDIA/GC PCR
Chlamydia amplified: NEGATIVE
N. gonorrhea, amplified: NEGATIVE

## 2011-05-17 LAB — CBC W/O DIFF
HCT: 33.3 % — ABNORMAL LOW (ref 35.0–45.0)
HGB: 10.8 g/dL — ABNORMAL LOW (ref 12.0–16.0)
MCH: 28.2 PG (ref 24.0–34.0)
MCHC: 32.4 g/dL (ref 31.0–37.0)
MCV: 86.9 FL (ref 74.0–97.0)
MPV: 9.6 FL (ref 9.2–11.8)
PLATELET: 321 10*3/uL (ref 135–420)
RBC: 3.83 M/uL — ABNORMAL LOW (ref 4.20–5.30)
RDW: 15.4 % — ABNORMAL HIGH (ref 11.6–14.5)
WBC: 8.1 10*3/uL (ref 4.6–13.2)

## 2011-05-17 LAB — METABOLIC PANEL, BASIC
Anion gap: 9 mmol/L (ref 3.0–18)
BUN/Creatinine ratio: 8 — ABNORMAL LOW (ref 12–20)
BUN: 7 MG/DL (ref 7.0–18)
CO2: 27 MMOL/L (ref 21–32)
Calcium: 9.1 MG/DL (ref 8.5–10.1)
Chloride: 105 MMOL/L (ref 100–108)
Creatinine: 0.9 MG/DL (ref 0.6–1.3)
GFR est AA: >60 mL/min/{1.73_m2} (ref >60)
GFR est non-AA: >60 mL/min/{1.73_m2} (ref >60)
Glucose: 83 MG/DL (ref 74–99)
Potassium: 3.7 MMOL/L (ref 3.5–5.5)
Sodium: 141 MMOL/L (ref 136–145)

## 2011-05-17 NOTE — H&P (Signed)
FOLLOW-UP VISIT:     DIAGNOSIS:              1.      High-grade dysplasia, recurrent, during pregnancy.  2.      LEEP procedure, 2012, carcinoma in situ.     INTERVAL HISTORY: The patient returns post-partum.  She had a baby girl by vaginal delivery two months ago.  She and the baby are both doing fine.  She is not breastfeeding.       REVIEW OF SYSTEMS:   CONSTITUTIONAL: No fever, chills, anorexia, weight loss, weakness, or sleep disturbance.  CARDIOVASCULAR: No chest pain, palpitations, syncope, or claudication.  RESPIRATORY:  No cough, shortness of breath, hemoptysis, or orthopnea.  GI:  No nausea, vomiting, frequency or caliber of stools, blood in the stools, or diarrhea.  INTEGUMENTARY (skin, breasts): No breast pain, lumps, nipple discharge, or axillary lumps.  The patient notes no rash or skin irritation systemically.  ENDOCRINE:  No cold intolerance, excessive fatigue, or sleep disturbance.  HEM/LYMPH:  No anemia, easy bruising, history of bleeding disorders, or lymphedema.  GU:  No urinary frequency, dysuria, hematuria, or history of stones.  NEURO:  No numbness or focal weakness.  No tremors.  No problems with voiding or defecation.  PSYCHE:  No symptoms of depression or anxiety.  No hallucinations or symptoms of psychosis.     EXAMINATION:                      RESPIRATORY:  The chest is clear to auscultation and percussion bilaterally.  CARDIOVASCULAR:  The heart is regular and without murmur.    GENITOURINARY:  The external genitalia, vagina, and cervix are without gross lesion.  Colposcopy was performed after a thorough acetic acid soak.  There continues to be an area of well-demarcated somewhat dense acetowhite epithelium centered around the nine o???clock position with subtle punctation.  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  No upper vaginal lesions seen.       ASSESSMENT:  Persistent high-grade dysplasia now post-partum.     PLAN: I reviewed with the patient our previous plans and recommended that  we move forward with cold knife conization of the cervix.  The details, risks, and postoperative recovery were reviewed.  At one point, we had talked about definitive therapy by way of hysterectomy.  She is not certain that she would necessarily like to do this and I suggested that we talk about it again after the pathology report is back from the conization.     cc:     Hugh Dixon [place], M.D.

## 2011-05-17 NOTE — H&P (View-Only) (Signed)
FOLLOW-UP VISIT:     DIAGNOSIS:              1.      High-grade dysplasia, recurrent, during pregnancy.  2.      LEEP procedure, 2012, carcinoma in situ.     INTERVAL HISTORY: The patient returns post-partum.  She had a baby girl by vaginal delivery two months ago.  She and the baby are both doing fine.  She is not breastfeeding.       REVIEW OF SYSTEMS:   CONSTITUTIONAL: No fever, chills, anorexia, weight loss, weakness, or sleep disturbance.  CARDIOVASCULAR: No chest pain, palpitations, syncope, or claudication.  RESPIRATORY:  No cough, shortness of breath, hemoptysis, or orthopnea.  GI:  No nausea, vomiting, frequency or caliber of stools, blood in the stools, or diarrhea.  INTEGUMENTARY (skin, breasts): No breast pain, lumps, nipple discharge, or axillary lumps.  The patient notes no rash or skin irritation systemically.  ENDOCRINE:  No cold intolerance, excessive fatigue, or sleep disturbance.  HEM/LYMPH:  No anemia, easy bruising, history of bleeding disorders, or lymphedema.  GU:  No urinary frequency, dysuria, hematuria, or history of stones.  NEURO:  No numbness or focal weakness.  No tremors.  No problems with voiding or defecation.  PSYCHE:  No symptoms of depression or anxiety.  No hallucinations or symptoms of psychosis.     EXAMINATION:                      RESPIRATORY:  The chest is clear to auscultation and percussion bilaterally.  CARDIOVASCULAR:  The heart is regular and without murmur.    GENITOURINARY:  The external genitalia, vagina, and cervix are without gross lesion.  Colposcopy was performed after a thorough acetic acid soak.  There continues to be an area of well-demarcated somewhat dense acetowhite epithelium centered around the nine o???clock position with subtle punctation.  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  No upper vaginal lesions seen.       ASSESSMENT:  Persistent high-grade dysplasia now post-partum.     PLAN: I reviewed with the patient our previous plans and recommended that  we move forward with cold knife conization of the cervix.  The details, risks, and postoperative recovery were reviewed.  At one point, we had talked about definitive therapy by way of hysterectomy.  She is not certain that she would necessarily like to do this and I suggested that we talk about it again after the pathology report is back from the conization.     cc:     Verita Lamb, M.D.

## 2011-05-17 NOTE — Addendum Note (Signed)
Addended by: Taryll Reichenberger C on: 05/17/2011 02:59 PM     Modules accepted: Orders

## 2011-05-22 ENCOUNTER — Inpatient Hospital Stay: Payer: MEDICAID

## 2011-05-22 LAB — HCG URINE, QL. - POC: Pregnancy test,urine (POC): NEGATIVE

## 2011-05-22 MED ADMIN — microfibrillar collagen (AVITENE) powder: TOPICAL | @ 15:00:00 | NDC 53276101002

## 2011-05-22 MED ADMIN — lactated ringers infusion: INTRAVENOUS | @ 14:00:00 | NDC 00409795309

## 2011-05-22 MED ADMIN — lactated ringers infusion: INTRAVENOUS | @ 16:00:00 | NDC 00409795309

## 2011-05-22 MED ADMIN — oxyCODONE-acetaminophen (PERCOCET) 5-325 mg per tablet 1-2 Tab: ORAL | @ 17:00:00 | NDC 00406051262

## 2011-05-22 MED ADMIN — OTHER(NON-FORMULARY): @ 15:00:00 | NDC 17317026304

## 2011-05-22 MED ADMIN — bupivacaine-EPINEPHrine (PF) (SENSORCAINE PF) 0.5 %-1:200,000 injection: @ 15:00:00 | NDC 00409904501

## 2011-05-22 MED ADMIN — 0.9% sodium chloride 1,000 mL Irrigation: @ 15:00:00 | NDC 00338004904

## 2011-05-22 MED FILL — MIDAZOLAM 1 MG/ML IJ SOLN: 1 mg/mL | INTRAMUSCULAR | Qty: 2

## 2011-05-22 MED FILL — BD POSIFLUSH NORMAL SALINE 0.9 % INJECTION SYRINGE: INTRAMUSCULAR | Qty: 10

## 2011-05-22 MED FILL — LACTATED RINGERS IV: INTRAVENOUS | Qty: 1000

## 2011-05-22 MED FILL — MARCAINE-EPINEPHRINE (PF) 0.5 %-1:200,000 INJECTION SOLUTION: 0.5 %-1:200,000 | INTRAMUSCULAR | Qty: 30

## 2011-05-22 MED FILL — OXYCODONE-ACETAMINOPHEN 5 MG-325 MG TAB: 5-325 mg | ORAL | Qty: 1

## 2011-05-22 MED FILL — FENTANYL CITRATE (PF) 50 MCG/ML IJ SOLN: 50 mcg/mL | INTRAMUSCULAR | Qty: 2

## 2011-05-22 NOTE — Brief Op Note (Signed)
BRIEF OPERATIVE NOTEOperative Report    Patient: Tiffany Hernandez MRN: [id number]  SSN: [id number]    Date of Birth: [birth year]  Age: 26 y.o.  Sex: female       Date of Surgery: 05/22/2011     Surgeon: Norris Cross    Assistant: Lieutenant Diego    Preoperative Diagnosis: cin 3     Postoperative Diagnosis: cin 3     Procedure: CKC    Anesthesia: General     Complications: none    Estimated Blood Loss:  minimal    Findings: cervix appeared grossly normal; no upper vaginal dysplasia           Specimens:   ID Type Source Tests Collected by Time Destination   1 : CONE OF CERVIX Preservative Other                  Vanessa Ralphs, MD 05/22/2011 1046 Pathology           Drains: None                Counts: Sponge and needle counts were correct times two.    Signed By:  Cecille Aver, NP     May 22, 2011

## 2011-05-22 NOTE — Progress Notes (Signed)
Post-Anesthesia Evaluation and Assessment    Patient: Tiffany Hernandez MRN: [id number]  SSN: [id number]    Date of Birth: 1985/12/30  Age: 26 y.o.  Sex: female       Cardiovascular Function/Vital Signs  Visit Vitals   Item Reading   ??? BP 130/62   ??? Pulse 58   ??? Temp 97.5 ??F (36.4 ??C)   ??? Resp 12   ??? Ht 5\' 7"  (1.702 m)   ??? Wt 59.421 kg (131 lb)   ??? BMI 20.52 kg/m2   ??? SpO2 100%       Patient is status post General anesthesia for Procedure(s):  conization of cervix.    Nausea/Vomiting: None    Postoperative hydration reviewed and adequate.    Pain:  Pain Scale 1: Numeric (0 - 10) (05/22/11 1341)  Pain Intensity 1: 0 (05/22/11 1341)   Managed    Neurological Status:   Neuro (WDL): Within Defined Limits (05/22/11 1200)   At baseline    Mental Status and Level of Consciousness: Alert and oriented to person, place, and time    Pulmonary Status:   O2 Device: Room air (05/22/11 1341)   Adequate oxygenation and airway patent    Complications related to anesthesia: None    Post-anesthesia assessment completed. No concerns    Signed By: Billy Fischer, MD     May 22, 2011

## 2011-05-22 NOTE — Op Note (Signed)
Operative Report    Patient: Tiffany Hernandez MRN: [id number]  SSN: [id number]    Date of Birth: 02/28/1985  Age: 26 y.o.  Sex: female       Date of Surgery: 05/22/2011     Surgeon: Lanier Millon C Kenyada Hy, MD    Assistant:  Cori Damuth, NP-C    Preoperative Diagnosis: cin 3     Postoperative Diagnosis: cin 3       Anesthesia: General       Complications: none    Estimated Blood Loss:  minimal    Findings: Aceto-white epithelium in transformation zone. No gross lesion      Procedure: After the patient was identified in the operating room, she was placed under general anesthesia by the anesthesia team. She was sterilely prepped and draped in the lithotomy position. Retractors were placed into the vagina. 0 Vicryl sutures were placed at the 3 and 9 o'clock position at the cervico-vaginal junction. A para-cervical block was performed using 0.5% Marcaine with epinephrine. The cervical canal was assessed with a sound. A scalpel was used to excise a cone shaped portion of the cervix down to the level of the internal os. The specimen was sent to pathology. The bed of the conization was thoroughly cauterized with the ball tip cautery. The posterior edge continued to ooze, and this was controlled with a 0 Vicryl suture. Excellent hemostasis was achieved. Avitene powder was packed into the conization bed and the instruments were removed. The patient was awakened and transported to the recovery room in stable condition. All needle, sponge, and instrument counts were noted to be correct at the end of the case.               Specimens:   ID Type Source Tests Collected by Time Destination   1 : CONE OF CERVIX Preservative Other                  Tamsyn Owusu C Kaipo Ardis, MD 05/22/2011 1046 Pathology             Drains: None                    Counts: Sponge and needle counts were correct times two.    Signed By:  Marva Hendryx C Wonda Goodgame, MD     May 22, 2011

## 2011-05-22 NOTE — Interval H&P Note (Signed)
Date of Surgery Update:  Tiffany Hernandez was seen and examined.  History and physical has been reviewed. There have been no significant clinical changes since the completion of the originally dated History and Physical.    Signed By: Vanessa Ralphs, MD     May 22, 2011 10:13 AM

## 2011-05-22 NOTE — Op Note (Signed)
Operative Report    Patient: Tiffany Hernandez MRN: [id number]  SSN: [id number]    Date of Birth: [birth year]  Age: 26 y.o.  Sex: female       Date of Surgery: 05/22/2011     Surgeon: Vanessa Ralphs, MD    Assistant:  Lieutenant Diego, NP-C    Preoperative Diagnosis: cin 3     Postoperative Diagnosis: cin 3       Anesthesia: General       Complications: none    Estimated Blood Loss:  minimal    Findings: Aceto-white epithelium in transformation zone. No gross lesion      Procedure: After the patient was identified in the operating room, she was placed under general anesthesia by the anesthesia team. She was sterilely prepped and draped in the lithotomy position. Retractors were placed into the vagina. 0 Vicryl sutures were placed at the 3 and 9 o'clock position at the cervico-vaginal junction. A para-cervical block was performed using 0.5% Marcaine with epinephrine. The cervical canal was assessed with a sound. A scalpel was used to excise a cone shaped portion of the cervix down to the level of the internal os. The specimen was sent to pathology. The bed of the conization was thoroughly cauterized with the ball tip cautery. The posterior edge continued to ooze, and this was controlled with a 0 Vicryl suture. Excellent hemostasis was achieved. Avitene powder was packed into the conization bed and the instruments were removed. The patient was awakened and transported to the recovery room in stable condition. All needle, sponge, and instrument counts were noted to be correct at the end of the case.               Specimens:   ID Type Source Tests Collected by Time Destination   1 : CONE OF CERVIX Preservative Other                  Vanessa Ralphs, MD 05/22/2011 1046 Pathology             Drains: None                    Counts: Sponge and needle counts were correct times two.    Signed By:  Vanessa Ralphs, MD     May 22, 2011

## 2011-05-23 MED FILL — ONDANSETRON (PF) 4 MG/2 ML INJECTION: 4 mg/2 mL | INTRAMUSCULAR | Qty: 4

## 2011-05-23 MED FILL — DIPRIVAN 10 MG/ML INTRAVENOUS EMULSION: 10 mg/mL | INTRAVENOUS | Qty: 20

## 2011-05-23 MED FILL — KETOROLAC TROMETHAMINE 60 MG/2 ML IM: 60 mg/2 mL | INTRAMUSCULAR | Qty: 1

## 2011-05-23 MED FILL — LIDOCAINE 2 % MUCOSAL GEL: 2 % | Qty: 5

## 2013-01-01 DIAGNOSIS — R87611 Atypical squamous cells cannot exclude high grade squamous intraepithelial lesion on cytologic smear of cervix (ASC-H): Secondary | ICD-10-CM | POA: Insufficient documentation

## 2014-05-22 ENCOUNTER — Inpatient Hospital Stay
Admit: 2014-05-22 | Discharge: 2014-05-22 | Disposition: A | Discharge status: Home / Self Care | Payer: MEDICAID | Attending: Emergency Medicine

## 2014-05-22 DIAGNOSIS — Z008 Encounter for other general examination: Secondary | ICD-10-CM

## 2014-05-22 MED ORDER — PRENATAL VITAMIN,CALCIUM,MINERALS-IRON-FOLIC ACID TABLET
ORAL_TABLET | Freq: Every day | ORAL | Status: AC
Start: 2014-05-22 — End: ?

## 2014-05-22 NOTE — ED Notes (Signed)
Pt discharged in stable condition. I have reviewed discharge instructions with the patient.  The patient verbalized understanding.Armband removed and shredded.  Pt left with 1 prescription.   Current Discharge Medication List      START taking these medications    Details   prenatal multivit-ca-min-fe-fa (PRENATAL VITAMIN) tab Take 1 Tab by mouth daily.  Qty: 30 Tab, Refills: 0         CONTINUE these medications which have NOT CHANGED    Details   acetaminophen (TYLENOL) 325 mg tablet Take 650 mg by mouth every four (4) hours as needed. Indications: PAIN

## 2014-05-22 NOTE — ED Notes (Signed)
Wants the full treatment of STD exposure per the pt

## 2014-05-22 NOTE — ED Provider Notes (Signed)
HPI Comments: 8:38 AM  Tiffany Hernandez is a 29 y.o. female who presents to the ED for further treatment of previously diagnosed STD. Pt states that she was diagnosed with Chlamydia at [facility], after having routine blood work performed, but requests further treatment. Pt reports that she was given 2 pills of Azithromycin, and nothing else. No further symptoms or complaints expressed at this time.        The history is provided by the patient.        No past medical history on file.    Past Surgical History:   Procedure Laterality Date   ??? Hx other surgical       leep april 2012 [place]         Family History:   Problem Relation Age of Onset   ??? Cancer Maternal Aunt    ??? Cancer Maternal Grandmother    ??? Cancer Maternal Grandfather        History     Social History   ??? Marital Status: SINGLE     Spouse Name: N/A   ??? Number of Children: N/A   ??? Years of Education: N/A     Occupational History   ??? Not on file.     Social History Main Topics   ??? Smoking status: Never Smoker    ??? Smokeless tobacco: Never Used   ??? Alcohol Use: No   ??? Drug Use: No   ??? Sexual Activity:     Partners: Male     Pharmacist, hospital Protection: None     Other Topics Concern   ??? Not on file     Social History Narrative           ALLERGIES: Review of patient's allergies indicates no known allergies.      Review of Systems   Constitutional: Negative for fever, chills and appetite change.   HENT: Negative for congestion, sinus pressure and trouble swallowing.    Eyes: Negative for pain and visual disturbance.   Respiratory: Negative for cough, chest tightness, shortness of breath and wheezing.    Cardiovascular: Negative for chest pain, palpitations and leg swelling.   Gastrointestinal: Negative for nausea, vomiting and abdominal pain.   Endocrine: Negative.    Genitourinary: Negative.    Musculoskeletal: Negative for myalgias, back pain, arthralgias and neck pain.   Skin: Negative.    Allergic/Immunologic: Negative.     Neurological: Negative for dizziness, syncope, numbness and headaches.   Hematological: Negative.    Psychiatric/Behavioral: Negative.    All other systems reviewed and are negative.      Filed Vitals:    05/22/14 0839   BP: 115/79   Pulse: 62   Temp: 98.5 ??F (36.9 ??C)   Resp: 16   Height:  (1.702 m)   Weight: 60.328 kg (133 lb)   SpO2: 100%            Physical Exam   Constitutional: She appears well-developed and well-nourished.  Non-toxic appearance. She does not have a sickly appearance. She does not appear ill. No distress.   HENT:   Head: Normocephalic and atraumatic.   Mouth/Throat: Oropharynx is clear and moist. No oropharyngeal exudate.   Eyes: Conjunctivae and EOM are normal. Pupils are equal, round, and reactive to light. No scleral icterus.   Neck: Normal range of motion. Neck supple. No hepatojugular reflux and no JVD present. No tracheal deviation present. No thyromegaly present.   Cardiovascular: Normal rate, regular rhythm, S1 normal, S2 normal,  normal heart sounds, intact distal pulses and normal pulses.  Exam reveals no gallop, no S3 and no S4.    No murmur heard.  Pulses:       Radial pulses are 2+ on the right side, and 2+ on the left side.        Dorsalis pedis pulses are 2+ on the right side, and 2+ on the left side.   Pulmonary/Chest: Effort normal and breath sounds normal. No respiratory distress. She has no decreased breath sounds. She has no wheezes. She has no rhonchi. She has no rales.   Abdominal: Soft. Normal appearance and bowel sounds are normal. She exhibits no distension and no mass. There is no hepatosplenomegaly. There is no tenderness. There is no rigidity, no rebound, no guarding, no CVA tenderness, no tenderness at McBurney's point and negative Murphy's sign.   Musculoskeletal: Normal range of motion.   Lymphadenopathy:        Head (right side): No submental, no submandibular, no preauricular and no occipital adenopathy present.         Head (left side): No submental, no submandibular, no preauricular and no occipital adenopathy present.     She has no cervical adenopathy.        Right: No supraclavicular adenopathy present.        Left: No supraclavicular adenopathy present.   Neurological: She is alert. She has normal strength and normal reflexes. She is not disoriented. No cranial nerve deficit or sensory deficit. Coordination and gait normal. GCS eye subscore is 4. GCS verbal subscore is 5. GCS motor subscore is 6.   Skin: Skin is warm, dry and intact. No rash noted. She is not diaphoretic.   Psychiatric: She has a normal mood and affect. Her speech is normal and behavior is normal. Judgment and thought content normal. Cognition and memory are normal.   Nursing note and vitals reviewed.       MDM  Number of Diagnoses or Management Options  Well adult health check:   Diagnosis management comments: STD treatment       Procedures        Vitals:  Patient Vitals for the past 12 hrs:   Temp Pulse Resp BP SpO2   05/22/14 0839 98.5 ??F (36.9 ??C) 62 16 115/79 mmHg 100 %       Medications ordered:   Medications - No data to display      Lab findings:  No results found for this or any previous visit (from the past 12 hour(s)).      Progress Note:  8:46 AM  Pt Dx with Chlamydia 05/19/2014. Gonorrhea is negative. No need for further testing at this time.    Reevaluation of patient:    Patient was discharged in stable condition.  Patient was reassessed and feeling better.  Patient was discharged with  Rx of Pre-natal vitamins.  Patient is to return to emergency department if any new or worsening condition.    8:50 AM, 05/22/2014       Disposition:  Diagnosis:   1. Well adult health check        Disposition: Discharged    Scribe Attestation:   I, F. Kale Mathias III, am scribing for and in the presence of Maura CrandallRene Stony Stegmann, DO on this day 05/22/2014 at 8:40 AM   F. Layla MawKale Mathias III, scribe    Provider Attestation:   I personally performed the services described in the documentation, reviewed the documentation, as recorded by the scribe in my  presence, and it accurately and completely records my words and actions.  Betzaira Mentel, DO. 8:40 AM

## 2014-05-22 NOTE — ED Notes (Signed)
Formatting of this note is different from the original.  Pt discharged in stable condition. I have reviewed discharge instructions with the patient.  The patient verbalized understanding.Armband removed and shredded.  Pt left with 1 prescription.   Current Discharge Medication List     START taking these medications    Details   prenatal multivit-ca-min-fe-fa (PRENATAL VITAMIN) tab Take 1 Tab by mouth daily.  Qty: 30 Tab, Refills: 0       CONTINUE these medications which have NOT CHANGED    Details   acetaminophen (TYLENOL) 325 mg tablet Take 650 mg by mouth every four (4) hours as needed. Indications: PAIN           Electronically signed by Jacky Kindle, RN at 05/22/2014  8:57 AM EDT

## 2014-05-22 NOTE — ED Notes (Signed)
Formatting of this note might be different from the original.  Wants the full treatment of STD exposure per the pt  Electronically signed by Jayme Cloud, RN at 05/22/2014  8:41 AM EDT

## 2014-05-22 NOTE — ED Provider Notes (Signed)
Formatting of this note is different from the original.      HPI Comments: 8:38 AM  Tiffany Hernandez is a 29 y.o. female who presents to the ED for further treatment of previously diagnosed STD. Pt states that she was diagnosed with Chlamydia at [facility], after having routine blood work performed, but requests further treatment. Pt reports that she was given 2 pills of Azithromycin, and nothing else. No further symptoms or complaints expressed at this time.    The history is provided by the patient.       No past medical history on file.    Past Surgical History:   Procedure Laterality Date   ? Hx other surgical       leep april 2012 [place]       Family History:   Problem Relation Age of Onset   ? Cancer Maternal Aunt    ? Cancer Maternal Grandmother    ? Cancer Maternal Grandfather      History     Social History   ? Marital Status: SINGLE     Spouse Name: N/A   ? Number of Children: N/A   ? Years of Education: N/A     Occupational History   ? Not on file.     Social History Main Topics   ? Smoking status: Never Smoker    ? Smokeless tobacco: Never Used   ? Alcohol Use: No   ? Drug Use: No   ? Sexual Activity:     Partners: Male     Pharmacist, hospital Protection: None     Other Topics Concern   ? Not on file     Social History Narrative     ALLERGIES: Review of patient's allergies indicates no known allergies.    Review of Systems   Constitutional: Negative for fever, chills and appetite change.   HENT: Negative for congestion, sinus pressure and trouble swallowing.    Eyes: Negative for pain and visual disturbance.   Respiratory: Negative for cough, chest tightness, shortness of breath and wheezing.    Cardiovascular: Negative for chest pain, palpitations and leg swelling.   Gastrointestinal: Negative for nausea, vomiting and abdominal pain.   Endocrine: Negative.    Genitourinary: Negative.    Musculoskeletal: Negative for myalgias, back pain, arthralgias and neck pain.   Skin: Negative.    Allergic/Immunologic:  Negative.    Neurological: Negative for dizziness, syncope, numbness and headaches.   Hematological: Negative.    Psychiatric/Behavioral: Negative.    All other systems reviewed and are negative.    Filed Vitals:    05/22/14 0839   BP: 115/79   Pulse: 62   Temp: 98.5 F (36.9 C)   Resp: 16   Height: 5\' 7"  (1.702 m)   Weight: 60.328 kg (133 lb)   SpO2: 100%       Physical Exam   Constitutional: She appears well-developed and well-nourished.  Non-toxic appearance. She does not have a sickly appearance. She does not appear ill. No distress.   HENT:   Head: Normocephalic and atraumatic.   Mouth/Throat: Oropharynx is clear and moist. No oropharyngeal exudate.   Eyes: Conjunctivae and EOM are normal. Pupils are equal, round, and reactive to light. No scleral icterus.   Neck: Normal range of motion. Neck supple. No hepatojugular reflux and no JVD present. No tracheal deviation present. No thyromegaly present.   Cardiovascular: Normal rate, regular rhythm, S1 normal, S2 normal, normal heart sounds, intact distal pulses and normal pulses.  Exam reveals no gallop, no S3 and no S4.    No murmur heard.  Pulses:       Radial pulses are 2+ on the right side, and 2+ on the left side.        Dorsalis pedis pulses are 2+ on the right side, and 2+ on the left side.   Pulmonary/Chest: Effort normal and breath sounds normal. No respiratory distress. She has no decreased breath sounds. She has no wheezes. She has no rhonchi. She has no rales.   Abdominal: Soft. Normal appearance and bowel sounds are normal. She exhibits no distension and no mass. There is no hepatosplenomegaly. There is no tenderness. There is no rigidity, no rebound, no guarding, no CVA tenderness, no tenderness at McBurney's point and negative Murphy's sign.   Musculoskeletal: Normal range of motion.   Lymphadenopathy:        Head (right side): No submental, no submandibular, no preauricular and no occipital adenopathy present.        Head (left side): No submental,  no submandibular, no preauricular and no occipital adenopathy present.     She has no cervical adenopathy.        Right: No supraclavicular adenopathy present.        Left: No supraclavicular adenopathy present.   Neurological: She is alert. She has normal strength and normal reflexes. She is not disoriented. No cranial nerve deficit or sensory deficit. Coordination and gait normal. GCS eye subscore is 4. GCS verbal subscore is 5. GCS motor subscore is 6.   Skin: Skin is warm, dry and intact. No rash noted. She is not diaphoretic.   Psychiatric: She has a normal mood and affect. Her speech is normal and behavior is normal. Judgment and thought content normal. Cognition and memory are normal.   Nursing note and vitals reviewed.      MDM  Number of Diagnoses or Management Options  Well adult health check:   Diagnosis management comments: STD treatment     Procedures    Vitals:  Patient Vitals for the past 12 hrs:   Temp Pulse Resp BP SpO2   05/22/14 0839 98.5 F (36.9 C) 62 16 115/79 mmHg 100 %     Medications ordered:   Medications - No data to display      Lab findings:  No results found for this or any previous visit (from the past 12 hour(s)).    Progress Note:  8:46 AM  Pt Dx with Chlamydia 05/19/2014. Gonorrhea is negative. No need for further testing at this time.    Reevaluation of patient:    Patient was discharged in stable condition.  Patient was reassessed and feeling better.  Patient was discharged with  Rx of Pre-natal vitamins.  Patient is to return to emergency department if any new or worsening condition.    8:50 AM, 05/22/2014     Disposition:  Diagnosis:   1. Well adult health check      Disposition: Discharged    Scribe Attestation:   I, F. Kale Mathias III, am scribing for and in the presence of Maura Crandall, DO on this day 05/22/2014 at 8:40 AM   F. Layla Maw III, scribe    Provider Attestation:  I personally performed the services described in the documentation, reviewed the documentation, as  recorded by the scribe in my presence, and it accurately and completely records my words and actions.  Rene Moncion, DO. 8:40 AM  Electronically signed by Neal Dy, DO at 05/22/2014  8:55 AM EDT

## 2015-04-20 ENCOUNTER — Encounter: Payer: Self-pay | Admitting: *Deleted

## 2015-04-20 ENCOUNTER — Emergency Department
Admission: EM | Admit: 2015-04-20 | Discharge: 2015-04-20 | Disposition: A | Discharge status: Home / Self Care | Payer: Self-pay | Attending: Emergency Medicine | Admitting: Emergency Medicine

## 2015-04-20 DIAGNOSIS — N898 Other specified noninflammatory disorders of vagina: Secondary | ICD-10-CM | POA: Insufficient documentation

## 2015-04-20 DIAGNOSIS — N72 Inflammatory disease of cervix uteri: Secondary | ICD-10-CM | POA: Insufficient documentation

## 2015-04-20 LAB — COMPREHENSIVE METABOLIC PANEL
ALK PHOS: 55 U/L (ref 38–126)
ALT: 13 U/L — ABNORMAL LOW (ref 14–54)
ANION GAP: 6 (ref 5–15)
AST: 18 U/L (ref 15–41)
Albumin: 3.9 g/dL (ref 3.5–5.0)
BUN: 10 mg/dL (ref 6–20)
CALCIUM: 9.3 mg/dL (ref 8.9–10.3)
CHLORIDE: 106 mmol/L (ref 101–111)
CO2: 26 mmol/L (ref 22–32)
Creatinine, Ser: 0.75 mg/dL (ref 0.44–1.00)
GFR calc non Af Amer: >60 mL/min (ref >60)
Glucose, Bld: 89 mg/dL (ref 65–99)
Potassium: 4 mmol/L (ref 3.5–5.1)
SODIUM: 138 mmol/L (ref 135–145)
Total Bilirubin: 0.5 mg/dL (ref 0.3–1.2)
Total Protein: 7.1 g/dL (ref 6.5–8.1)

## 2015-04-20 LAB — POCT PREGNANCY, URINE: Preg Test, Ur: NEGATIVE

## 2015-04-20 LAB — URINALYSIS COMPLETE WITH MICROSCOPIC (ARMC ONLY)
Bilirubin Urine: NEGATIVE
Glucose, UA: NEGATIVE mg/dL
HGB URINE DIPSTICK: NEGATIVE
KETONES UR: NEGATIVE mg/dL
Leukocytes, UA: NEGATIVE
NITRITE: NEGATIVE
PH: 5 (ref 5.0–8.0)
PROTEIN: NEGATIVE mg/dL
SPECIFIC GRAVITY, URINE: 1.025 (ref 1.005–1.030)

## 2015-04-20 LAB — CBC
HCT: 34.3 % — ABNORMAL LOW (ref 35.0–47.0)
Hemoglobin: 11.6 g/dL — ABNORMAL LOW (ref 12.0–16.0)
MCH: 31.8 pg (ref 26.0–34.0)
MCHC: 33.9 g/dL (ref 32.0–36.0)
MCV: 94 fL (ref 80.0–100.0)
PLATELETS: 278 10*3/uL (ref 150–440)
RBC: 3.65 MIL/uL — ABNORMAL LOW (ref 3.80–5.20)
RDW: 13.2 % (ref 11.5–14.5)
WBC: 10.9 10*3/uL (ref 3.6–11.0)

## 2015-04-20 LAB — CHLAMYDIA/NGC RT PCR (ARMC ONLY)
CHLAMYDIA TR: NOT DETECTED
N GONORRHOEAE: NOT DETECTED

## 2015-04-20 LAB — WET PREP, GENITAL
CLUE CELLS WET PREP: NONE SEEN
Sperm: NONE SEEN
Trich, Wet Prep: NONE SEEN
Yeast Wet Prep HPF POC: NONE SEEN

## 2015-04-20 LAB — RAPID HIV SCREEN (HIV 1/2 AB+AG)
HIV 1/2 ANTIBODIES: NONREACTIVE
HIV-1 P24 Antigen - HIV24: NONREACTIVE

## 2015-04-20 MED ORDER — AZITHROMYCIN 250 MG PO TABS
ORAL_TABLET | ORAL | Status: AC
  Administered 2015-04-20: 1000 mg via ORAL
  Filled 2015-04-20: qty 4

## 2015-04-20 MED ORDER — CEFTRIAXONE SODIUM 250 MG IJ SOLR
250.0000 mg | Freq: Once | INTRAMUSCULAR | Status: AC
  Administered 2015-04-20: 250 mg via INTRAMUSCULAR

## 2015-04-20 MED ORDER — AZITHROMYCIN 250 MG PO TABS
1000.0000 mg | ORAL_TABLET | ORAL | Status: AC
  Administered 2015-04-20: 1000 mg via ORAL

## 2015-04-20 MED ORDER — CEFTRIAXONE SODIUM 250 MG IJ SOLR
INTRAMUSCULAR | Status: AC
Start: 2015-04-20 — End: 2015-04-20
  Administered 2015-04-20: 250 mg via INTRAMUSCULAR
  Filled 2015-04-20: qty 250
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
## 2015-04-20 NOTE — ED Notes (Addendum)
Pt states lower abdominal pain and white, thick vaginal discharge x 2 weeks. Thought it was a yeast infection so treated that OTC. Had a miscariage and D&C a few weeks ago. Thinks it might be bacterial infection or STD, says previous partner had given her a STD before. States not having cramps, no bumps, denies itching. Last intercourse was about 6 weeks ago.

## 2015-04-20 NOTE — Discharge Instructions (Signed)
You have been seen today in the Emergency Department (ED) for pelvic pain and discharge.  We believe your symptoms may be caused by one or more sexual transmitted infections, although the results were not all back at the time of your emergency department discharge.  You have been treated with a one time dose medication for both of these conditions (gonorrhea and Chlamydia) just in case they are positive.  Your HIV rapid screening test was negative.  Please have your partner tested for STD's and do not resume sexual activity until you and your partner have confirmed negative test results or have both been treated.  Please follow up with your doctor as soon as possible regarding todays ED visit and your symptoms.   Return to the ED if your pain worsens, you develop a fever, or for any other symptoms that concern you.   Cervicitis Cervicitis is a soreness and swelling (inflammation) of the cervix. Your cervix is located at the bottom of your uterus. It opens up to the vagina. CAUSES   Sexually transmitted infections (STIs).   Allergic reaction.   Medicines or birth control devices that are put in the vagina.   Injury to the cervix.   Bacterial infections.  RISK FACTORS You are at greater risk if you:  Have unprotected sexual intercourse.  Have sexual intercourse with many partners.  Began sexual intercourse at an early age.  Have a history of STIs. SYMPTOMS  There may be no symptoms. If symptoms occur, they may include:   Gray, white, yellow, or bad-smelling vaginal discharge.   Pain or itching of the area outside the vagina.   Painful sexual intercourse.   Lower abdominal or lower back pain, especially during intercourse.   Frequent urination.   Abnormal vaginal bleeding between periods, after sexual intercourse, or after menopause.   Pressure or a heavy feeling in the pelvis.  DIAGNOSIS  Diagnosis is made after a pelvic exam. Other tests may include:    Examination of any discharge under a microscope (wet prep).   A Pap test.  TREATMENT  Treatment will depend on the cause of cervicitis. If it is caused by an STI, both you and your partner will need to be treated. Antibiotic medicines will be given.  HOME CARE INSTRUCTIONS   Do not have sexual intercourse until your health care provider says it is okay.   Do not have sexual intercourse until your partner has been treated, if your cervicitis is caused by an STI.   Take your antibiotics as directed. Finish them even if you start to feel better.  SEEK MEDICAL CARE IF:  Your symptoms come back.   You have a fever.  MAKE SURE YOU:   Understand these instructions.  Will watch your condition.  Will get help right away if you are not doing well or get worse.   This information is not intended to replace advice given to you by your health care provider. Make sure you discuss any questions you have with your health care provider.   Document Released: 12/18/2004 Document Revised: 12/23/2012 Document Reviewed: 06/11/2012 Elsevier Interactive Patient Education [employer]2016 [employer]vier Inc.

## 2015-04-20 NOTE — ED Notes (Addendum)
Pt reports she has low abd pain and vaginal discharge.  Sx for 1 week.  No vag bleeding.   Pt had a D&C two weeks ago.  Pt had a miscarriage. Pt alert.

## 2015-04-20 NOTE — ED Notes (Signed)
Called lab to see if they could add on Rapid HIV to lavender top already in lab. They stated they could and would run the test.

## 2015-04-21 ENCOUNTER — Encounter: Payer: Self-pay | Admitting: Emergency Medicine

## 2015-04-21 ENCOUNTER — Emergency Department
Admission: EM | Admit: 2015-04-21 | Discharge: 2015-04-21 | Disposition: A | Discharge status: Home / Self Care | Payer: Self-pay | Attending: Emergency Medicine | Admitting: Emergency Medicine

## 2015-04-21 DIAGNOSIS — J020 Streptococcal pharyngitis: Secondary | ICD-10-CM | POA: Insufficient documentation

## 2015-04-21 DIAGNOSIS — R51 Headache: Secondary | ICD-10-CM | POA: Insufficient documentation

## 2015-04-21 DIAGNOSIS — R509 Fever, unspecified: Secondary | ICD-10-CM | POA: Insufficient documentation

## 2015-04-21 DIAGNOSIS — M791 Myalgia: Secondary | ICD-10-CM | POA: Insufficient documentation

## 2015-04-21 MED ORDER — PENICILLIN G BENZATHINE & PROC 1200000 UNIT/2ML IM SUSP: 2.4000 10*6.[IU] | Freq: Once | INTRAMUSCULAR | Status: DC

## 2015-04-21 MED ORDER — PENICILLIN G BENZATHINE 1200000 UNIT/2ML IM SUSP
INTRAMUSCULAR | Status: AC
  Filled 2015-04-21: qty 2

## 2015-04-21 MED ORDER — PENICILLIN G BENZATHINE 600000 UNIT/ML IM SUSP
600000.0000 [IU] | Freq: Once | INTRAMUSCULAR | Status: AC
  Administered 2015-04-21: 09:00:00 via INTRAMUSCULAR

## 2015-04-21 NOTE — Discharge Instructions (Signed)

## 2015-04-21 NOTE — ED Provider Notes (Signed)
CSN: [id number]     Arrival date & time 04/21/15  0830 History   First MD Initiated Contact with Patient 04/21/15 [phone]     Chief Complaint  Patient presents with  . Sore Throat     HPI   30 year old female who presents to the ER for evaluation of a sore throat. Her 2 daughters are currently being treated for strep throat. She states she awakened this morning with a fever and sore throat and body aches. No cough, no otalgia, or other symptoms. She has not taken anything for the pain/fever.  History reviewed. No pertinent past medical history. Past Surgical History  Procedure Laterality Date  . Cervical cone biopsy     No family history on file. Social History  Substance Use Topics  . Smoking status: Never Smoker   . Smokeless tobacco: None  . Alcohol Use: No   OB History    No data available     Review of Systems  Constitutional: Positive for fever and activity change.  HENT: Positive for sore throat. Negative for ear discharge, ear pain, rhinorrhea and trouble swallowing.   Respiratory: Negative for cough and shortness of breath.   Gastrointestinal: Negative for nausea, vomiting and diarrhea.  Musculoskeletal: Positive for myalgias.  Skin: Negative for rash.  Neurological: Positive for headaches.      Allergies  Review of patient's allergies indicates no known allergies.  Home Medications   Prior to Admission medications   Not on File   BP 122/79 mmHg  Pulse 106  Temp(Src) 99.2 F (37.3 C) (Oral)  Resp 18  Ht 5\' 7"  (1.702 m)  Wt 61.236 kg  BMI 21.14 kg/m2  SpO2 95%  LMP 02/05/2015 Physical Exam  Constitutional: She is oriented to person, place, and time. She appears well-developed and well-nourished.  HENT:  Right Ear: External ear normal.  Left Ear: External ear normal.  Mouth/Throat: Uvula is midline. Oropharyngeal exudate and posterior oropharyngeal erythema present. No posterior oropharyngeal edema.  Eyes: EOM are normal.  Neck: Normal range of  motion.  Pulmonary/Chest: Effort normal and breath sounds normal.  Musculoskeletal: Normal range of motion.  Neurological: She is alert and oriented to person, place, and time.  Skin: Skin is warm and dry.  Psychiatric: She has a normal mood and affect. Her behavior is normal. Judgment and thought content normal.  Nursing note and vitals reviewed.   ED Course  Procedures (including critical care time) Labs Review Labs Reviewed - No data to display  Imaging Review No results found. I have personally reviewed and evaluated these images and lab results as part of my medical decision-making.   EKG Interpretation None      MDM   Final diagnoses:  Strep pharyngitis    Patient chose to have the penicillin injection instead of penicillin prescription. She was advised to follow-up with the primary care provider for choice for symptoms that are not improving over the next few days. She was advised to take Tylenol or ibuprofen as needed for pain or fever. She was instructed to return to the emergency department for symptoms that change or worsen if she is unable schedule an appointment.    Chinita PesterCari B Geralene Afshar, FNP 04/21/15 [id number]  Sharyn CreamerMark Quale, MD 04/21/15 [phone]

## 2015-04-21 NOTE — ED Notes (Signed)
Pt informed to return if any life threatening symptoms occur.  

## 2015-04-21 NOTE — ED Notes (Signed)
Pt presents to ED with sore throat. Pt states thinks she has had a fever. Pt denies any other symptoms. Pt states her two daughters have recently been diagnosed with strep throat.

## 2015-05-12 ENCOUNTER — Encounter: Payer: Self-pay | Admitting: Emergency Medicine

## 2015-05-12 ENCOUNTER — Emergency Department
Admission: EM | Admit: 2015-05-12 | Discharge: 2015-05-12 | Disposition: A | Discharge status: Home / Self Care | Payer: BLUE CROSS/BLUE SHIELD | Attending: Emergency Medicine | Admitting: Emergency Medicine

## 2015-05-12 DIAGNOSIS — N949 Unspecified condition associated with female genital organs and menstrual cycle: Secondary | ICD-10-CM

## 2015-05-12 DIAGNOSIS — N760 Acute vaginitis: Secondary | ICD-10-CM | POA: Diagnosis present

## 2015-05-12 LAB — URINALYSIS COMPLETE WITH MICROSCOPIC (ARMC ONLY)
BILIRUBIN URINE: NEGATIVE
Glucose, UA: NEGATIVE mg/dL
Ketones, ur: NEGATIVE mg/dL
LEUKOCYTES UA: NEGATIVE
Nitrite: NEGATIVE
PH: 5 (ref 5.0–8.0)
Protein, ur: NEGATIVE mg/dL
Specific Gravity, Urine: 1.013 (ref 1.005–1.030)

## 2015-05-12 LAB — GLUCOSE, CAPILLARY: Glucose-Capillary: 102 mg/dL — ABNORMAL HIGH (ref 65–99)

## 2015-05-12 LAB — POCT PREGNANCY, URINE: PREG TEST UR: NEGATIVE

## 2015-05-12 LAB — WET PREP, GENITAL
Clue Cells Wet Prep HPF POC: NONE SEEN
Sperm: NONE SEEN
TRICH WET PREP: NONE SEEN
YEAST WET PREP: NONE SEEN

## 2015-05-12 NOTE — Discharge Instructions (Signed)
[facility]. You may also [facility] with [facility] if needed.

## 2015-05-12 NOTE — ED Notes (Signed)
Pt presents to ED with reports of vaginal itching and white cottage cheese appearing discharge. Pt states was seen here recently and diagnosed with STD and given antibiotics. Pt reports she thinks she has a yeast infection from taking antibiotics.

## 2015-05-12 NOTE — ED Notes (Signed)
See triage noted  States thick white discharge started after antibiotics

## 2015-05-12 NOTE — ED Provider Notes (Signed)
[facility] Emergency Department Provider Note  ____________________________________________  Time seen: Approximately 12:27 PM  I have reviewed the triage vital signs and the nursing notes.   HISTORY  Chief Complaint Vaginitis    HPI Stephanie Faulkner is a 30 y.o. female is here with complaint of vaginal discomfort and white discharge. Patient was seen on 4/20 in which time she was treated for an STD. She believes that the antibiotics caused a yeast infection. She states she has family members who do have diabetes but she herself has never been tested. She has not used any over-the-counter medication. Patient did complete all medications. She states she has an appointment with a doctor at [facility] for continued medical care.   History reviewed. No pertinent past medical history.  There are no active problems to display for this patient.   Past Surgical History  Procedure Laterality Date  . Cervical cone biopsy      No current outpatient prescriptions on file.  Allergies Review of patient's allergies indicates no known allergies.  No family history on file.  Social History Social History  Substance Use Topics  . Smoking status: Never Smoker   . Smokeless tobacco: None  . Alcohol Use: No    Review of Systems Constitutional: No fever/chills Cardiovascular: Denies chest pain. Respiratory: Denies shortness of breath. Gastrointestinal: No abdominal pain.  No nausea, no vomiting.   Genitourinary: Negative for dysuria.Questionable vaginal itching. Musculoskeletal: Negative for back pain. Skin: Negative for rash. Neurological: Negative for headaches, focal weakness or numbness.  10-point ROS otherwise negative.  ____________________________________________   PHYSICAL EXAM:  VITAL SIGNS: ED Triage Vitals  Enc Vitals Group     BP 05/12/15 1158 110/69 mmHg     Pulse Rate 05/12/15 1158 89     Resp 05/12/15 1158 18     Temp 05/12/15 1158 98.2  F (36.8 C)     Temp Source 05/12/15 1158 Oral     SpO2 05/12/15 1158 100 %     Weight 05/12/15 1158 138 lb (62.596 kg)     Height 05/12/15 1158  (1.702 m)     Head Cir --      Peak Flow --      Pain Score 05/12/15 1204 0     Pain Loc --      Pain Edu? --      Excl. in GC? --     Constitutional: Alert and oriented. Well appearing and in no acute distress. Eyes: Conjunctivae are normal. PERRL. EOMI. Head: Atraumatic. Nose: No congestion/rhinnorhea. Neck: No stridor.   Cardiovascular: Normal rate, regular rhythm. Grossly normal heart sounds.  Good peripheral circulation. Respiratory: Normal respiratory effort.  No retractions. Lungs CTAB. Gastrointestinal: Soft and nontender. No distention. Vaginal Exam:  No abnormalities are noted external genitalia. There is minimal white secretions noted but no cottage cheese like material that the patient has described. There is no cervical irritation. There is no adnexal masses or tenderness present. No cervical motion tenderness. Musculoskeletal: No lower extremity tenderness nor edema.  Neurologic:  Normal speech and language. No gross focal neurologic deficits are appreciated. No gait instability. Skin:  Skin is warm, dry and intact. No rash noted. Psychiatric: Mood and affect are normal. Speech and behavior are normal.  ____________________________________________   LABS (all labs ordered are listed, but only abnormal results are displayed)  Labs Reviewed  WET PREP, GENITAL - Abnormal; Notable for the following:    WBC, Wet Prep HPF POC MODERATE (*)  All other components within normal limits  URINALYSIS COMPLETEWITH MICROSCOPIC ([facility] ONLY) - Abnormal; Notable for the following:    Color, Urine YELLOW (*)    APPearance CLEAR (*)    Hgb urine dipstick 1+ (*)    Bacteria, UA MANY (*)    Squamous Epithelial / LPF 0-5 (*)    All other components within normal limits  GLUCOSE, CAPILLARY - Abnormal; Notable for the following:     Glucose-Capillary 102 (*)    All other components within normal limits  POC URINE PREG, ED  POCT PREGNANCY, URINE  CBG MONITORING, ED    PROCEDURES  Procedure(s) performed: None  Critical Care performed: No  ____________________________________________   INITIAL IMPRESSION / ASSESSMENT AND PLAN / ED COURSE  Pertinent labs & imaging results that were available during my care of the patient were reviewed by me and considered in my medical decision making (see chart for details).    Patient was told wet Prep was negative. She was encouraged [facility] end [facility]. She is also given information about her as [place] health Department if any continued vaginal problems. ____________________________________________   FINAL CLINICAL IMPRESSION(S) / ED DIAGNOSES  Final diagnoses:  Vaginal discomfort      NEW MEDICATIONS STARTED DURING THIS VISIT:  There are no discharge medications for this patient.    Note:  This document was prepared using Dragon voice recognition software and may include unintentional dictation errors.    Tommi RumpsRhonda L Summers, PA-C 05/12/15 1611  Jennye MoccasinBrian S Quigley, MD 05/13/15 [phone]

## 2015-05-23 LAB — HM PAP SMEAR: HM Pap smear: NEGATIVE

## 2015-05-25 ENCOUNTER — Encounter: Payer: Self-pay | Admitting: Emergency Medicine

## 2015-05-25 ENCOUNTER — Emergency Department: Payer: BLUE CROSS/BLUE SHIELD

## 2015-05-25 ENCOUNTER — Emergency Department
Admission: EM | Admit: 2015-05-25 | Discharge: 2015-05-25 | Disposition: A | Discharge status: Home / Self Care | Payer: BLUE CROSS/BLUE SHIELD | Attending: Emergency Medicine | Admitting: Emergency Medicine

## 2015-05-25 DIAGNOSIS — N899 Noninflammatory disorder of vagina, unspecified: Secondary | ICD-10-CM | POA: Diagnosis not present

## 2015-05-25 DIAGNOSIS — N898 Other specified noninflammatory disorders of vagina: Secondary | ICD-10-CM

## 2015-05-25 DIAGNOSIS — R102 Pelvic and perineal pain: Secondary | ICD-10-CM | POA: Diagnosis present

## 2015-05-25 HISTORY — DX: Complete or unspecified spontaneous abortion without complication: O03.9

## 2015-05-25 HISTORY — DX: Other specified postprocedural states: Z98.890

## 2015-05-25 HISTORY — DX: Reserved for inherently not codable concepts without codable children: IMO0001

## 2015-05-25 LAB — URINALYSIS COMPLETE WITH MICROSCOPIC (ARMC ONLY)
BILIRUBIN URINE: NEGATIVE
GLUCOSE, UA: NEGATIVE mg/dL
Hgb urine dipstick: NEGATIVE
Ketones, ur: NEGATIVE mg/dL
LEUKOCYTES UA: NEGATIVE
NITRITE: NEGATIVE
Protein, ur: NEGATIVE mg/dL
RBC / HPF: NONE SEEN RBC/hpf (ref 0–5)
SPECIFIC GRAVITY, URINE: 1.008 (ref 1.005–1.030)
pH: 6 (ref 5.0–8.0)

## 2015-05-25 LAB — COMPREHENSIVE METABOLIC PANEL
ALT: 15 U/L (ref 14–54)
ANION GAP: 6 (ref 5–15)
AST: 18 U/L (ref 15–41)
Albumin: 4.3 g/dL (ref 3.5–5.0)
Alkaline Phosphatase: 62 U/L (ref 38–126)
BUN: 10 mg/dL (ref 6–20)
CALCIUM: 9.5 mg/dL (ref 8.9–10.3)
CHLORIDE: 106 mmol/L (ref 101–111)
CO2: 26 mmol/L (ref 22–32)
CREATININE: 0.66 mg/dL (ref 0.44–1.00)
GLUCOSE: 108 mg/dL — AB (ref 65–99)
POTASSIUM: 3.2 mmol/L — AB (ref 3.5–5.1)
Sodium: 138 mmol/L (ref 135–145)
TOTAL PROTEIN: 7.6 g/dL (ref 6.5–8.1)
Total Bilirubin: 0.5 mg/dL (ref 0.3–1.2)

## 2015-05-25 LAB — CBC
HEMATOCRIT: 40.3 % (ref 35.0–47.0)
Hemoglobin: 13.3 g/dL (ref 12.0–16.0)
MCH: 31 pg (ref 26.0–34.0)
MCHC: 33.1 g/dL (ref 32.0–36.0)
MCV: 93.7 fL (ref 80.0–100.0)
PLATELETS: 279 10*3/uL (ref 150–440)
RBC: 4.3 MIL/uL (ref 3.80–5.20)
RDW: 13.1 % (ref 11.5–14.5)
WBC: 8.3 10*3/uL (ref 3.6–11.0)

## 2015-05-25 LAB — WET PREP, GENITAL
CLUE CELLS WET PREP: NONE SEEN
Sperm: NONE SEEN
TRICH WET PREP: NONE SEEN
Yeast Wet Prep HPF POC: NONE SEEN

## 2015-05-25 LAB — HCG, QUANTITATIVE, PREGNANCY

## 2015-05-25 LAB — POCT PREGNANCY, URINE: PREG TEST UR: NEGATIVE

## 2015-05-25 MED ORDER — METRONIDAZOLE 500 MG PO TABS: 500.0000 mg | ORAL_TABLET | Freq: Two times a day (BID) | ORAL | Status: AC

## 2015-05-25 NOTE — ED Notes (Addendum)
Patient presents to the ED with pelvic pain and vaginal discharge since April 5th when patient had a D&C.  Patient states she was recently seen at an ED in [place] and was told that she may have some retained fluids.  Patient states she is having a large amount of cloudy, white discharge that soaks multiple panty liners.  Patient states she has been evaluated for STDs and was told she was negative.  Patient states she was given antibiotics for an STD which patient took.  Patient's last menstrual period was April 30th.  Patient states she was supposed to see an Ob-gyn in [place] but had insurance/residency issues.

## 2015-05-25 NOTE — ED Provider Notes (Signed)
Time Seen: Approximately 1450  I have reviewed the triage notes  Chief Complaint: Pelvic Pain   History of Present Illness: Stephanie Faulkner is a 30 y.o. female who presents with a persistent white vaginal discharge since she had a D&C back at the beginning of April. She is been here to emergency department and also to the health department for evaluation of her discharge. Patient states she's had multiple STD screens which have been negative. She was told on her last screen 4 days ago that she did have clue cells present but it wasn't enough to treat for bacterial vaginosis. She states she had a scheduled appointment with an OB/GYN at [place] but was unable to establish that due to a change in residence. She now resides here in the [place] area and has no local OB/GYN available for follow-up. Vaginal has a large amount of cloudy white discharge and occasionally soaks her pantys  She states she is getting frustrated because sheis sexually transmitted hasn't had any recent sexual contact and still has this persistent discharge. She denies any vaginal bleeding, fever, or abdominal pain.   Past Medical History  Diagnosis Date  . Abortion   . History of D&C     There are no active problems to display for this patient.   Past Surgical History  Procedure Laterality Date  . Cervical cone biopsy      Past Surgical History  Procedure Laterality Date  . Cervical cone biopsy      Current Outpatient Rx  Name  Route  Sig  Dispense  Refill  . metroNIDAZOLE (FLAGYL) 500 MG tablet   Oral   Take 1 tablet (500 mg total) by mouth 2 (two) times daily.   14 tablet   0     Allergies:  Review of patient's allergies indicates no known allergies.  Family History: No family history on file.  Social History: Social History  Substance Use Topics  . Smoking status: Never Smoker   . Smokeless tobacco: None  . Alcohol Use: No     Review of Systems:   10 point review of systems was  performed and was otherwise negative:  Constitutional: No fever Eyes: No visual disturbances ENT: No sore throat, ear pain Cardiac: No chest pain Respiratory: No shortness of breath, wheezing, or stridor Abdomen: No abdominal pain, no vomiting, No diarrhea Endocrine: No weight loss, No night sweats Extremities: No peripheral edema, cyanosis Skin: No rashes, easy bruising Neurologic: No focal weakness, trouble with speech or swollowing Urologic: No dysuria, Hematuria, or urinary frequency She denies any joint discomfort or sore throat. She denies any vaginal lesions.  Physical Exam:  ED Triage Vitals  Enc Vitals Group     BP 05/25/15 1442 118/76 mmHg     Pulse Rate 05/25/15 1442 80     Resp 05/25/15 1442 20     Temp 05/25/15 1442 98.6 F (37 C)     Temp Source 05/25/15 1442 Oral     SpO2 05/25/15 1442 100 %     Weight --      Height --      Head Cir --      Peak Flow --      Pain Score 05/25/15 1447 4     Pain Loc --      Pain Edu? --      Excl. in GC? --     General: Awake , Alert , and Oriented times 3; GCS 15 Head: Normal cephalic , atraumatic Eyes:  Pupils equal , round, reactive to light Nose/Throat: No nasal drainage, patent upper airway without erythema or exudate.  Neck: Supple, Full range of motion, No anterior adenopathy or palpable thyroid masses Lungs: Clear to ascultation without wheezes , rhonchi, or rales Heart: Regular rate, regular rhythm without murmurs , gallops , or rubs Abdomen: Soft, non tender without rebound, guarding , or rigidity; bowel sounds positive and symmetric in all 4 quadrants. No organomegaly .        Extremities: 2 plus symmetric pulses. No edema, clubbing or cyanosis Neurologic: normal ambulation, Motor symmetric without deficits, sensory intact Skin: warm, dry, no rashes Pelvic exam with chaperone present shows a right copious discharge that does not appear yeastlike in appearance. It does not have a foul odor or thick with no  cervical motion tenderness. No adnexal tenderness or masses. There is no visible lesions on the external exam.  Labs:   All laboratory work was reviewed including any pertinent negatives or positives listed below:  Labs Reviewed  WET PREP, GENITAL - Abnormal; Notable for the following:    WBC, Wet Prep HPF POC FEW (*)    All other components within normal limits  COMPREHENSIVE METABOLIC PANEL - Abnormal; Notable for the following:    Potassium 3.2 (*)    Glucose, Bld 108 (*)    All other components within normal limits  URINALYSIS COMPLETEWITH MICROSCOPIC ([facility] ONLY) - Abnormal; Notable for the following:    Color, Urine YELLOW (*)    APPearance CLEAR (*)    Bacteria, UA MANY (*)    Squamous Epithelial / LPF 0-5 (*)    All other components within normal limits  CBC  HCG, QUANTITATIVE, PREGNANCY  POCT PREGNANCY, URINE  POC URINE PREG, ED  Patient's wet prep was negative.   Radiology:   CLINICAL DATA: Vaginal discharge since [facility] April 5th. "Retained products of conception following delivery without hemorrhage"  EXAM: TRANSABDOMINAL AND TRANSVAGINAL ULTRASOUND OF PELVIS  TECHNIQUE: Both transabdominal and transvaginal ultrasound examinations of the pelvis were performed. Transabdominal technique was performed for global imaging of the pelvis including uterus, ovaries, adnexal regions, and pelvic cul-de-sac. It was necessary to proceed with endovaginal exam following the transabdominal exam to visualize the endometrium and ovaries.  COMPARISON: None  FINDINGS: Uterus  Measurements: 9 x 5 x 6 cm. No fibroids or other mass visualized.  Endometrium  Thickness: 5 mm. No focal abnormality visualized. Mildly indistinct junctional zone but no sub endometrial cystic change.  Right ovary  Measurements: 32 x 16 x 29 mm. Normal appearance/no adnexal mass.  Left ovary  Measurements: 30 x 18 x 25 mm. Normal appearance/no adnexal mass.  Other findings  No abnormal  free fluid.  IMPRESSION: Normal pelvic ultrasound.   Electronically Signed By: Marnee Spring M.D. On: 05/25/2015 18:19   I personally reviewed the radiologic studies    ED Course:  The patient's stay here was uneventful and performing the ultrasound to make sure there wasn't any signs of pelvic inflammatory disease from her previous procedure, or retained products, etc. The patient's discharge does not appear yeastlike and with her history I felt it was a remote possibility for bacterial vaginosis. She has not been on Flagyl that she has been treated for sexually transmitted disease with the Zithromax and cephalosporin, etc. We felt another test for sexually transmitted disease was not necessary at this time as patient just had one performed 4 days ago. Patient was given a follow-up to OB/GYN unassigned to see if they had any  other further ideas about what her discharge may be from what may be causing it.    Assessment:  Vaginal discharge Possible bacterial vaginosis   Final Clinical Impression:   Final diagnoses:  Vaginal disorder     Plan: * Outpatient management Patient was advised to return immediately if condition worsens. Patient was advised to follow up with their primary care physician or other specialized physicians involved in their outpatient care. The patient and/or family member/power of attorney had laboratory results reviewed at the bedside. All questions and concerns were addressed and appropriate discharge instructions were distributed by the nursing staff.            Jennye Moccasin, MD 05/25/15 2049

## 2015-06-18 ENCOUNTER — Ambulatory Visit
Admission: EM | Admit: 2015-06-18 | Discharge: 2015-06-18 | Disposition: A | Discharge status: Home / Self Care | Payer: Medicaid Other | Attending: Family Medicine | Admitting: Family Medicine

## 2015-06-18 DIAGNOSIS — N898 Other specified noninflammatory disorders of vagina: Secondary | ICD-10-CM | POA: Diagnosis not present

## 2015-06-18 DIAGNOSIS — Z202 Contact with and (suspected) exposure to infections with a predominantly sexual mode of transmission: Secondary | ICD-10-CM

## 2015-06-18 LAB — WET PREP, GENITAL
CLUE CELLS WET PREP: NONE SEEN
SPERM: NONE SEEN
TRICH WET PREP: NONE SEEN
Yeast Wet Prep HPF POC: NONE SEEN

## 2015-06-18 LAB — CHLAMYDIA/NGC RT PCR (ARMC ONLY)
CHLAMYDIA TR: NOT DETECTED
N gonorrhoeae: NOT DETECTED

## 2015-06-18 LAB — PREGNANCY, URINE: Preg Test, Ur: NEGATIVE

## 2015-06-18 MED ORDER — CEFTRIAXONE SODIUM 250 MG IJ SOLR
250.0000 mg | Freq: Once | INTRAMUSCULAR | Status: AC
  Administered 2015-06-18: 250 mg via INTRAMUSCULAR

## 2015-06-18 MED ORDER — DOXYCYCLINE HYCLATE 100 MG PO CAPS: 100.0000 mg | ORAL_CAPSULE | Freq: Two times a day (BID) | ORAL | Status: DC

## 2015-06-18 NOTE — ED Provider Notes (Signed)
CSN: [id number]     Arrival date & time 06/18/15  [id number] History   First MD Initiated Contact with Patient 06/18/15 1027     Chief Complaint  Patient presents with  . Exposure to STD    Pt reports unprotected sex on Wedensday with parter who reported he had Chlamydia. Having discomfort in pelvic region, very mild.    (Consider location/radiation/quality/duration/timing/severity/associated sxs/prior Treatment) HPI: Patient presents today with symptoms of mild discomfort in the pelvic region. Patient states that she has had a history of unprotected sex and her ex-partner now recently told her that he was diagnosed with chlamydia. She states that he was treated. She has had a history of chlamydia in the past. She denies any fever or chills any genital rash or dysuria. She denies any nausea or vomiting. She states that she recently switched over to Depo for birth control and that her last menstrual period was at the beginning of June. She has had occasional spotting since that time.  Past Medical History  Diagnosis Date  . Abortion   . History of D&C    Past Surgical History  Procedure Laterality Date  . Cervical cone biopsy     History reviewed. No pertinent family history. Social History  Substance Use Topics  . Smoking status: Never Smoker   . Smokeless tobacco: None  . Alcohol Use: No   OB History    No data available     Review of Systems: Negative except mentioned above.   Allergies  Review of patient's allergies indicates no known allergies.  Home Medications   Prior to Admission medications   Medication Sig Start Date End Date Taking? Authorizing Provider  medroxyPROGESTERone (DEPO-PROVERA) 150 MG/ML injection Inject 150 mg into the muscle every 3 (three) months.   Yes Historical Provider, MD  Multiple Vitamin (MULTIVITAMIN WITH MINERALS) TABS tablet Take 1 tablet by mouth daily.   Yes Historical Provider, MD   Meds Ordered and Administered this Visit  Medications - No  data to display  BP 103/73 mmHg  Pulse 61  Temp(Src) 98.1 F (36.7 C) (Oral)  Resp 18  Ht 5\' 7"  (1.702 m)  Wt 144 lb (65.318 kg)  BMI 22.55 kg/m2  SpO2 100%  LMP 06/04/2015 No data found.   Physical Exam:    GENERAL: NAD HEENT: no pharyngeal erythema, no exudate, no erythema of TMs, no cervical LAD RESP: CTA B CARD: RRR ABD: +BS, NT GU: no genital lesions, minimal dark discharge (looks like old blood), no foreign body appreciated, minimal CMT, no adnexal tenderness NEURO: CN II-XII grossly intact   ED Course  Procedures (including critical care time)  Labs Review Labs Reviewed  PREGNANCY, URINE    Imaging Review No results found.     MDM  A/P: Vaginal Discharge, STD Exposure Will treat patient with Rocephin 250 mg IM, Doxycycline 100 mg twice a day 7 days, GC/Chlamydia sent to lab, recommend follow-up with health Department, safe sex practices discussed, advised on HIV testing at least annually if having unprotected sex.    Jolene ProvostKirtida Jeana Kersting, MD 06/18/15 1144

## 2015-07-06 ENCOUNTER — Ambulatory Visit
Admission: EM | Admit: 2015-07-06 | Discharge: 2015-07-06 | Disposition: A | Discharge status: Home / Self Care | Payer: BLUE CROSS/BLUE SHIELD | Attending: Family Medicine | Admitting: Family Medicine

## 2015-07-06 ENCOUNTER — Encounter: Payer: Self-pay | Admitting: Emergency Medicine

## 2015-07-06 DIAGNOSIS — R102 Pelvic and perineal pain: Secondary | ICD-10-CM | POA: Diagnosis not present

## 2015-07-06 LAB — URINALYSIS COMPLETE WITH MICROSCOPIC (ARMC ONLY)
Bacteria, UA: NONE SEEN
Bilirubin Urine: NEGATIVE
GLUCOSE, UA: NEGATIVE mg/dL
Hgb urine dipstick: NEGATIVE
KETONES UR: NEGATIVE mg/dL
Leukocytes, UA: NEGATIVE
Nitrite: NEGATIVE
Protein, ur: NEGATIVE mg/dL
RBC / HPF: NONE SEEN RBC/hpf (ref 0–5)
SQUAMOUS EPITHELIAL / LPF: NONE SEEN
pH: 5.5 (ref 5.0–8.0)

## 2015-07-06 LAB — WET PREP, GENITAL
CLUE CELLS WET PREP: NONE SEEN
Sperm: NONE SEEN
TRICH WET PREP: NONE SEEN
Yeast Wet Prep HPF POC: NONE SEEN

## 2015-07-06 LAB — PREGNANCY, URINE: Preg Test, Ur: NEGATIVE

## 2015-07-06 NOTE — ED Notes (Signed)
Patient states she was here for STD check and was told tests were negative. Patient states she feels the test was wrong and wants to either be tested or treated

## 2015-07-06 NOTE — Discharge Instructions (Signed)
Follow-up with GYN this week. Someone will inform me from our office about the Chlamydia/Gonorrhea results and if you need to be treated again.

## 2015-07-06 NOTE — ED Provider Notes (Addendum)
CSN: [id number]     Arrival date & time 07/06/15  1736 History   First MD Initiated Contact with Patient 07/06/15 1821     Chief Complaint  Patient presents with  . Abdominal Pain   (Consider location/radiation/quality/duration/timing/severity/associated sxs/prior Treatment) HPI: Patient presents today with symptoms of intermittent pelvic discomfort. Patient states that she completed the doxycycline that was given to her a few weeks ago for suspected STD. She was also given Rocephin in the office. Her chlamydia and gonorrhea tested come back negative. Patient states that she has not had sex since she was treated. She would like to be tested again. He denies any dysuria, vaginal discharge, fever, nausea, vomiting. She thinks that maybe her symptoms could be due to starting Depo, she has only had 1 shot so far. She is currently on her menstrual cycle.  Past Medical History  Diagnosis Date  . Abortion   . History of D&C    Past Surgical History  Procedure Laterality Date  . Cervical cone biopsy     No family history on file. Social History  Substance Use Topics  . Smoking status: Never Smoker   . Smokeless tobacco: Not on file  . Alcohol Use: No   OB History    No data available     Review of Systems: Negative except mentioned above.   Allergies  Review of patient's allergies indicates no known allergies.  Home Medications   Prior to Admission medications   Medication Sig Start Date End Date Taking? Authorizing Provider  doxycycline (VIBRAMYCIN) 100 MG capsule Take 1 capsule (100 mg total) by mouth 2 (two) times daily. 06/18/15   Jolene ProvostKirtida Kirsi Hugh, MD  medroxyPROGESTERone (DEPO-PROVERA) 150 MG/ML injection Inject 150 mg into the muscle every 3 (three) months.    Historical Provider, MD  Multiple Vitamin (MULTIVITAMIN WITH MINERALS) TABS tablet Take 1 tablet by mouth daily.    Historical Provider, MD   Meds Ordered and Administered this Visit  Medications - No data to display  LMP  06/04/2015 No data found.   Physical Exam:  GENERAL: NAD HEENT: no pharyngeal erythema, no exudate RESP: CTA B CARD: RRR ABD: +BS, NT, no rebound or guarding  GU: no external lesions, +vaginal bleeding, no other discharge appreciated, no CMT NEURO: CN II-XII grossly intact   ED Course  Procedures (including critical care time)  Labs Review Labs Reviewed - No data to display  Imaging Review No results found.    MDM  A/P: Pelvic discomfort- reviewed wet prep, UA, and urine preg. results, GC/Chlamydia pending, exam was benign, recommend awaiting results for GC/Chlamydia before treating this time. I discussed with the patient that possibly her mild discomfort could be due to the Depo-Provera that she just started. I've asked that she follow-up with GYN this week. Can take Ibuprofen as needed for pain.  If her symptoms were to acutely worsen I have asked that she go to the ER. I've also advised her not to have sex until her results are back and treatment is completed if needed and her symptoms have resolved.    Jolene ProvostKirtida Travaughn Vue, MD 07/06/15 [phone]  Jolene ProvostKirtida Aadarsh Cozort, MD 07/06/15 [phone]

## 2015-07-07 LAB — CHLAMYDIA/NGC RT PCR (ARMC ONLY)
CHLAMYDIA TR: NOT DETECTED
N gonorrhoeae: NOT DETECTED

## 2015-07-08 ENCOUNTER — Telehealth: Payer: Self-pay | Admitting: *Deleted

## 2015-07-08 NOTE — ED Notes (Signed)
Called patient and informed her that her gonorrhea, chlamydia, pregnancy, and UA all came back negative for infection and results. Patient confirmed understanding of information.

## 2016-04-06 ENCOUNTER — Ambulatory Visit: Payer: Self-pay | Admitting: Obstetrics and Gynecology

## 2016-07-10 ENCOUNTER — Encounter: Payer: Self-pay | Admitting: Emergency Medicine

## 2016-07-10 ENCOUNTER — Emergency Department
Admission: EM | Admit: 2016-07-10 | Discharge: 2016-07-11 | Disposition: A | Discharge status: Home / Self Care | Payer: Medicaid Other | Attending: Emergency Medicine | Admitting: Emergency Medicine

## 2016-07-10 DIAGNOSIS — F331 Major depressive disorder, recurrent, moderate: Secondary | ICD-10-CM | POA: Diagnosis not present

## 2016-07-10 DIAGNOSIS — R45851 Suicidal ideations: Secondary | ICD-10-CM | POA: Diagnosis present

## 2016-07-10 DIAGNOSIS — Z79899 Other long term (current) drug therapy: Secondary | ICD-10-CM | POA: Diagnosis not present

## 2016-07-10 HISTORY — DX: Anxiety disorder, unspecified: F41.9

## 2016-07-10 LAB — CBC
HCT: 40.4 % (ref 35.0–47.0)
Hemoglobin: 13.8 g/dL (ref 12.0–16.0)
MCH: 31.8 pg (ref 26.0–34.0)
MCHC: 34.2 g/dL (ref 32.0–36.0)
MCV: 93.1 fL (ref 80.0–100.0)
PLATELETS: 264 10*3/uL (ref 150–440)
RBC: 4.34 MIL/uL (ref 3.80–5.20)
RDW: 12.8 % (ref 11.5–14.5)
WBC: 7.7 10*3/uL (ref 3.6–11.0)

## 2016-07-10 LAB — URINALYSIS, COMPLETE (UACMP) WITH MICROSCOPIC
BACTERIA UA: NONE SEEN
BILIRUBIN URINE: NEGATIVE
Glucose, UA: NEGATIVE mg/dL
HGB URINE DIPSTICK: NEGATIVE
KETONES UR: NEGATIVE mg/dL
LEUKOCYTES UA: NEGATIVE
NITRITE: NEGATIVE
PROTEIN: NEGATIVE mg/dL
SPECIFIC GRAVITY, URINE: 1.014 (ref 1.005–1.030)
pH: 7 (ref 5.0–8.0)

## 2016-07-10 LAB — URINE DRUG SCREEN, QUALITATIVE (ARMC ONLY)
AMPHETAMINES, UR SCREEN: NOT DETECTED
BENZODIAZEPINE, UR SCRN: NOT DETECTED
Barbiturates, Ur Screen: NOT DETECTED
Cannabinoid 50 Ng, Ur ~~LOC~~: POSITIVE — AB
Cocaine Metabolite,Ur ~~LOC~~: NOT DETECTED
MDMA (ECSTASY) UR SCREEN: NOT DETECTED
METHADONE SCREEN, URINE: NOT DETECTED
Opiate, Ur Screen: NOT DETECTED
Phencyclidine (PCP) Ur S: NOT DETECTED
TRICYCLIC, UR SCREEN: NOT DETECTED

## 2016-07-10 LAB — COMPREHENSIVE METABOLIC PANEL
ALBUMIN: 3.4 g/dL — AB (ref 3.5–5.0)
ALT: 18 U/L (ref 14–54)
ANION GAP: 5 (ref 5–15)
AST: 18 U/L (ref 15–41)
Alkaline Phosphatase: 52 U/L (ref 38–126)
BILIRUBIN TOTAL: 0.5 mg/dL (ref 0.3–1.2)
BUN: 6 mg/dL (ref 6–20)
CHLORIDE: 109 mmol/L (ref 101–111)
CO2: 26 mmol/L (ref 22–32)
Calcium: 9 mg/dL (ref 8.9–10.3)
Creatinine, Ser: 0.86 mg/dL (ref 0.44–1.00)
GFR calc Af Amer: >60 mL/min (ref >60)
Glucose, Bld: 92 mg/dL (ref 65–99)
POTASSIUM: 3.4 mmol/L — AB (ref 3.5–5.1)
Sodium: 140 mmol/L (ref 135–145)
TOTAL PROTEIN: 6.2 g/dL — AB (ref 6.5–8.1)

## 2016-07-10 LAB — SALICYLATE LEVEL: Salicylate Lvl: <7 mg/dL (ref 2.8–30.0)

## 2016-07-10 LAB — POCT PREGNANCY, URINE: PREG TEST UR: NEGATIVE

## 2016-07-10 LAB — ETHANOL

## 2016-07-10 LAB — ACETAMINOPHEN LEVEL

## 2016-07-10 NOTE — ED Triage Notes (Signed)
Patient has been feeling hopeless and crying in triage. Patient has been having suicidal thoughts and called [place] police and they told her to come up here. Patient has been feeling angry and feels like ending it all would give her peace. Patient states the feelings are getting worse and wants help. Patient does not have plan but has feelings for wanting to kill her self.

## 2016-07-10 NOTE — ED Provider Notes (Signed)
[facility] Emergency Department Provider Note  ____________________________________________   I have reviewed the triage vital signs and the nursing notes.   HISTORY  Chief Complaint Mental Health Problem    HPI Stephanie Faulkner is a 31 y.o. female who presents today complaining of thoughts of self-harm. She has planned to take the radio into the shower with her, she is very depressed she has very significant economic issues and family issues. She denies physical or sexual abuse. She is tearful. She is not sleeping well. She was on Effexor but stopped taking it. She states that she has been thinking about hurting herself.  She states the only reason she hasn't killed herself is because she is a [place].  Past Medical History:  Diagnosis Date  . Abortion   . Anxiety   . History of D&C     There are no active problems to display for this patient.   Past Surgical History:  Procedure Laterality Date  . CERVICAL CONE BIOPSY      Prior to Admission medications   Medication Sig Start Date End Date Taking? Authorizing Provider  albuterol (PROVENTIL HFA) 108 (90 Base) MCG/ACT inhaler Inhale into the lungs. 04/06/16  Yes [provider]  ALPRAZolam Prudy Feeler) 0.25 MG tablet Take 0.25 mg by mouth daily as needed for anxiety.   Yes [provider]  montelukast (SINGULAIR) 10 MG tablet Take 10 mg by mouth daily. 04/06/16  Yes [provider]  Multiple Vitamin (MULTIVITAMIN WITH MINERALS) TABS tablet Take 1 tablet by mouth daily.   Yes [provider]  venlafaxine XR (EFFEXOR-XR) 75 MG 24 hr capsule Take 75 mg by mouth daily. 05/25/16 05/25/17 Yes [provider]  medroxyPROGESTERone (DEPO-PROVERA) 150 MG/ML injection Inject 150 mg into the muscle every 3 (three) months.    [provider]    Allergies Patient has no known allergies.  No family history on file.  Social History Social History  Substance Use  Topics  . Smoking status: Never Smoker  . Smokeless tobacco: Not on file  . Alcohol use No    Review of Systems Constitutional: No fever/chills Eyes: No visual changes. ENT: No sore throat. No stiff neck no neck pain Cardiovascular: Denies chest pain. Respiratory: Denies shortness of breath. Gastrointestinal:   no vomiting.  No diarrhea.  No constipation. Genitourinary: Negative for dysuria. Musculoskeletal: Negative lower extremity swelling Skin: Negative for rash. Neurological: Negative for severe headaches, focal weakness or numbness.   ____________________________________________   PHYSICAL EXAM:  VITAL SIGNS: ED Triage Vitals [07/10/16 1858]  Enc Vitals Group     BP (!) 152/101     Pulse Rate 99     Resp 16     Temp 99.1 F (37.3 C)     Temp Source Oral     SpO2 100 %     Weight 140 lb (63.5 kg)     Height 5\' 7"  (1.702 m)     Head Circumference      Peak Flow      Pain Score      Pain Loc      Pain Edu?      Excl. in GC?     Constitutional: Alert and oriented. Well appearing and in no acute distress. Eyes: Conjunctivae are normal Head: Atraumatic HEENT: No congestion/rhinnorhea. Mucous membranes are moist.  Oropharynx non-erythematous Neck:   Nontender with no meningismus, no masses, no stridor Cardiovascular: Normal rate, regular rhythm. Grossly normal heart sounds.  Good peripheral circulation. Respiratory:  Normal respiratory effort.  No retractions. Lungs CTAB. Abdominal: Soft and nontender. No distention. No guarding no rebound Back:  There is no focal tenderness or step off.  there is no midline tenderness there are no lesions noted. t Musculoskeletal: No lower extremity tenderness, no upper extremity tenderness. No joint effusions, no DVT signs strong distal pulses no edema Neurologic:  Normal speech and language. No gross focal neurologic deficits are appreciated.  Skin:  Skin is warm, dry and intact. No rash noted. Psychiatric: Mood and affect are  Flat and tearful. Speech and behavior are normal.  ____________________________________________   LABS (all labs ordered are listed, but only abnormal results are displayed)  Labs Reviewed  COMPREHENSIVE METABOLIC PANEL - Abnormal; Notable for the following:       Result Value   Potassium 3.4 (*)    Total Protein 6.2 (*)    Albumin 3.4 (*)    All other components within normal limits  ACETAMINOPHEN LEVEL - Abnormal; Notable for the following:    Acetaminophen (Tylenol), Serum <10 (*)    All other components within normal limits  URINE DRUG SCREEN, QUALITATIVE ([facility] ONLY) - Abnormal; Notable for the following:    Cannabinoid 50 Ng, Ur [place] POSITIVE (*)    All other components within normal limits  URINALYSIS, COMPLETE (UACMP) WITH MICROSCOPIC - Abnormal; Notable for the following:    Color, Urine YELLOW (*)    APPearance CLEAR (*)    Squamous Epithelial / LPF 0-5 (*)    All other components within normal limits  ETHANOL  SALICYLATE LEVEL  CBC  POC URINE PREG, ED  POCT PREGNANCY, URINE   ____________________________________________  EKG  I personally interpreted any EKGs ordered by me or triage  ____________________________________________  RADIOLOGY  I reviewed any imaging ordered by me or triage that were performed during my shift and, if possible, patient and/or family made aware of any abnormal findings. ____________________________________________   PROCEDURES  Procedure(s) performed: None  Procedures  Critical Care performed: None  ____________________________________________   INITIAL IMPRESSION / ASSESSMENT AND PLAN / ED COURSE  Pertinent labs & imaging results that were available during my care of the patient were reviewed by me and considered in my medical decision making (see chart for details).  Patient with very credible thoughts of self-harm, she has specific ideas about how she might do it. She has not done it yet because of religious inhibition  however, she is very depressed. I did take out IVC paperwork on her based on these complaints because I feel that she does present a danger to herself. Signed out at the end of my shift.    ____________________________________________   FINAL CLINICAL IMPRESSION(S) / ED DIAGNOSES  Final diagnoses:  None      This chart was dictated using voice recognition software.  Despite best efforts to proofread,  errors can occur which can change meaning.      [name], James A, MD 07/10/16 [phone]

## 2016-07-10 NOTE — ED Notes (Signed)
Patient assigned to appropriate care area. Patient oriented to unit/care area: Informed that, for their safety, care areas are designed for safety and monitored by security cameras at all times; and visiting hours explained to patient. Patient verbalizes understanding, and verbal contract for safety obtained. 

## 2016-07-10 NOTE — ED Notes (Signed)
Pt is becoming agitated and states that she needs to call a friend that is taking care of her children. Pt states that she has the only car in the house and her spouse is unable to pickup her children from her friends car. The Pt's phone was charged and given to the Pt only to obtain the phone number for her friend since she does not know the number by heart.

## 2016-07-11 NOTE — ED Provider Notes (Signed)
-----------------------------------------   2:09 AM on 07/11/2016 -----------------------------------------  [facility] Sherlon Handingodriguez who deems patient is psychiatrically stable for discharge and rescinded her IVC. She is to follow-up with outpatient mental health counseling. Strict return precautions given. Patient verbalizes understanding and agrees with plan of care.   Stephanie Faulkner, Jade J, MD 07/11/16 [phone]

## 2016-07-11 NOTE — Discharge Instructions (Signed)
Return to the ER for worsening symptoms, feelings of hurting yourself or others, or other concerns. 

## 2016-09-20 ENCOUNTER — Encounter: Payer: Self-pay | Admitting: Emergency Medicine

## 2016-09-20 ENCOUNTER — Emergency Department
Admission: EM | Admit: 2016-09-20 | Discharge: 2016-09-21 | Disposition: A | Discharge status: Home / Self Care | Payer: Medicaid Other | Attending: Emergency Medicine | Admitting: Emergency Medicine

## 2016-09-20 DIAGNOSIS — F12920 Cannabis use, unspecified with intoxication, uncomplicated: Secondary | ICD-10-CM | POA: Diagnosis not present

## 2016-09-20 DIAGNOSIS — R42 Dizziness and giddiness: Secondary | ICD-10-CM | POA: Diagnosis present

## 2016-09-20 DIAGNOSIS — Z79899 Other long term (current) drug therapy: Secondary | ICD-10-CM | POA: Diagnosis not present

## 2016-09-20 LAB — URINALYSIS, COMPLETE (UACMP) WITH MICROSCOPIC
Bilirubin Urine: NEGATIVE
Glucose, UA: NEGATIVE mg/dL
Hgb urine dipstick: NEGATIVE
Ketones, ur: NEGATIVE mg/dL
Leukocytes, UA: NEGATIVE
Nitrite: NEGATIVE
Protein, ur: NEGATIVE mg/dL
RBC / HPF: NONE SEEN RBC/hpf (ref 0–5)
Specific Gravity, Urine: 1.019 (ref 1.005–1.030)
pH: 5 (ref 5.0–8.0)

## 2016-09-20 LAB — URINE DRUG SCREEN, QUALITATIVE (ARMC ONLY)
Amphetamines, Ur Screen: NOT DETECTED
Barbiturates, Ur Screen: NOT DETECTED
Benzodiazepine, Ur Scrn: NOT DETECTED
Cannabinoid 50 Ng, Ur ~~LOC~~: POSITIVE — AB
Cocaine Metabolite,Ur ~~LOC~~: NOT DETECTED
MDMA (Ecstasy)Ur Screen: NOT DETECTED
Methadone Scn, Ur: NOT DETECTED
Opiate, Ur Screen: NOT DETECTED
Phencyclidine (PCP) Ur S: NOT DETECTED
Tricyclic, Ur Screen: NOT DETECTED

## 2016-09-20 LAB — CBC
HCT: 37.9 % (ref 35.0–47.0)
Hemoglobin: 13 g/dL (ref 12.0–16.0)
MCH: 31.4 pg (ref 26.0–34.0)
MCHC: 34.3 g/dL (ref 32.0–36.0)
MCV: 91.4 fL (ref 80.0–100.0)
Platelets: 324 10*3/uL (ref 150–440)
RBC: 4.15 MIL/uL (ref 3.80–5.20)
RDW: 13.1 % (ref 11.5–14.5)
WBC: 8.3 10*3/uL (ref 3.6–11.0)

## 2016-09-20 LAB — GLUCOSE, CAPILLARY: Glucose-Capillary: 97 mg/dL (ref 65–99)

## 2016-09-20 LAB — POCT PREGNANCY, URINE: Preg Test, Ur: NEGATIVE

## 2016-09-20 NOTE — ED Triage Notes (Addendum)
Patient ambulatory to triage with steady gait, without difficulty or distress noted; pt reports dizziness; plasma donation earlier today; denies any pain; denies hx of same; pt admits to marijuana use today; pt taken to room 18 by EDT Scott to be placed on card monitor for further eval

## 2016-09-21 LAB — BASIC METABOLIC PANEL
Anion gap: 7 (ref 5–15)
CHLORIDE: 108 mmol/L (ref 101–111)
CO2: 25 mmol/L (ref 22–32)
CREATININE: 0.76 mg/dL (ref 0.44–1.00)
Calcium: 8.5 mg/dL — ABNORMAL LOW (ref 8.9–10.3)
GFR calc Af Amer: >60 mL/min (ref >60)
GFR calc non Af Amer: >60 mL/min (ref >60)
Glucose, Bld: 109 mg/dL — ABNORMAL HIGH (ref 65–99)
POTASSIUM: 3.1 mmol/L — AB (ref 3.5–5.1)
Sodium: 140 mmol/L (ref 135–145)

## 2016-09-21 NOTE — ED Provider Notes (Signed)
[facility] Emergency Department Provider Note _   First MD Initiated Contact with Patient 09/20/16 2335     (approximate)  I have reviewed the triage vital signs and the nursing notes.   HISTORY  Chief Complaint Dizziness    HPI Stephanie Faulkner is a 31 y.o. female with below listof chronic medical conditions presents to the emergency department with acute onset of dizziness and rapid heartbeat approximately 10:00 tonight. Patient states that she smoked marijuana approximately 8:50 PM. Patient states that she donated plasma as well today and that she does so twice per week. Patient denies any chest pain. Patient denies any lower external pain or swelling. Patient denies any shunts of breath. At this time patient states symptoms have resolved.  Past Medical History:  Diagnosis Date  . Abortion   . Anxiety   . History of D&C     There are no active problems to display for this patient.   Past Surgical History:  Procedure Laterality Date  . CERVICAL CONE BIOPSY      Prior to Admission medications   Medication Sig Start Date End Date Taking? Authorizing Provider  albuterol (PROVENTIL HFA) 108 (90 Base) MCG/ACT inhaler Inhale into the lungs. 04/06/16   [provider]  ALPRAZolam Prudy Feeler) 0.25 MG tablet Take 0.25 mg by mouth daily as needed for anxiety.    [provider]  medroxyPROGESTERone (DEPO-PROVERA) 150 MG/ML injection Inject 150 mg into the muscle every 3 (three) months.    [provider]  montelukast (SINGULAIR) 10 MG tablet Take 10 mg by mouth daily. 04/06/16   [provider]  Multiple Vitamin (MULTIVITAMIN WITH MINERALS) TABS tablet Take 1 tablet by mouth daily.    [provider]  venlafaxine XR (EFFEXOR-XR) 75 MG 24 hr capsule Take 75 mg by mouth daily. 05/25/16 05/25/17  [provider]    Allergies no known drug allergies  No family history on file.  Social History Social History    Substance Use Topics  . Smoking status: Never Smoker  . Smokeless tobacco: Never Used  . Alcohol use No    Review of Systems Constitutional: No fever/chills Eyes: No visual changes. ENT: No sore throat. Cardiovascular: Denies chest pain.positive for rapid heartbeat Respiratory: Denies shortness of breath. Gastrointestinal: No abdominal pain.  No nausea, no vomiting.  No diarrhea.  No constipation. Genitourinary: Negative for dysuria. Musculoskeletal: Negative for neck pain.  Negative for back pain. Integumentary: Negative for rash. Neurological: Negative for headaches, focal weakness or numbness.positive for dizziness  ____________________________________________   PHYSICAL EXAM:  VITAL SIGNS: ED Triage Vitals  Enc Vitals Group     BP 09/20/16 2301 (!) 140/91     Pulse Rate 09/20/16 2301 (!) 132     Resp 09/20/16 2301 20     Temp 09/20/16 2301 97.6 F (36.4 C)     Temp Source 09/20/16 2301 Oral     SpO2 09/20/16 2301 98 %     Weight 09/20/16 2302 63 kg (139 lb)     Height 09/20/16 2302 1.702 m ( )     Head Circumference --      Peak Flow --      Pain Score --      Pain Loc --      Pain Edu? --      Excl. in GC? --     Constitutional: Alert and oriented. Well appearing and in no acute distress. Eyes: Conjunctivae are normal. Head: Atraumatic. Mouth/Throat: Mucous membranes  are moist.  Oropharynx non-erythematous. Neck: No stridor.   Cardiovascular: Normal rate, regular rhythm. Good peripheral circulation. Grossly normal heart sounds. Respiratory: Normal respiratory effort.  No retractions. Lungs CTAB. Gastrointestinal: Soft and nontender. No distention.  Musculoskeletal: No lower extremity tenderness nor edema. No gross deformities of extremities. Neurologic:  Normal speech and language. No gross focal neurologic deficits are appreciated.  Skin:  Skin is warm, dry and intact. No rash noted. Psychiatric: Mood and affect are normal. Speech and behavior are  normal.  ____________________________________________   LABS (all labs ordered are listed, but only abnormal results are displayed)  Labs Reviewed  BASIC METABOLIC PANEL - Abnormal; Notable for the following:       Result Value   Potassium 3.1 (*)    Glucose, Bld 109 (*)    BUN <5 (*)    Calcium 8.5 (*)    All other components within normal limits  URINALYSIS, COMPLETE (UACMP) WITH MICROSCOPIC - Abnormal; Notable for the following:    Color, Urine YELLOW (*)    APPearance CLEAR (*)    Bacteria, UA RARE (*)    Squamous Epithelial / LPF 0-5 (*)    All other components within normal limits  URINE DRUG SCREEN, QUALITATIVE ([facility] ONLY) - Abnormal; Notable for the following:    Cannabinoid 50 Ng, Ur [place] POSITIVE (*)    All other components within normal limits  CBC  GLUCOSE, CAPILLARY  POC URINE PREG, ED  POCT PREGNANCY, URINE  CBG MONITORING, ED   ____________________________________________  EKG  ED ECG REPORT I, [place] N Jaedynn Bohlken, the attending physician, personally viewed and interpreted this ECG.   Date: 09/21/2016  EKG Time: 10:59 PM  Rate: 131  Rhythm: sinus tachycardia  Axis: normal  Intervals:normal  ST&T Change: none ____________________________________________    Procedures   ____________________________________________   INITIAL IMPRESSION / ASSESSMENT AND PLAN / ED COURSE  Pertinent labs & imaging results that were available during my care of the patient were reviewed by me and considered in my medical decision making (see chart for details).  31 year old female presenting with above stated history of physical exam and EKG revealing sinus tachycardia. Patient's current heart rate 77 normal sinus rhythm. Patient has no symptoms at present      ____________________________________________  FINAL CLINICAL IMPRESSION(S) / ED DIAGNOSES  Final diagnoses:  Intoxication with marijuana, uncomplicated (HCC)     MEDICATIONS GIVEN DURING THIS  VISIT:  Medications - No data to display   NEW OUTPATIENT MEDICATIONS STARTED DURING THIS VISIT:  New Prescriptions   No medications on file    Modified Medications   No medications on file    Discontinued Medications   No medications on file     Note:  This document was prepared using Dragon voice recognition software and may include unintentional dictation errors.    Darci Current, MD 09/21/16 [phone]

## 2016-09-21 NOTE — ED Notes (Signed)

## 2016-09-21 NOTE — ED Notes (Signed)
Pt reports smoking weed tonight, reports hx of anxiety and states "I might have had a panic attack." Pt A&O and in NAD at this time. Pt on monitor.

## 2016-10-14 ENCOUNTER — Emergency Department
Admission: EM | Admit: 2016-10-14 | Discharge: 2016-10-14 | Discharge status: Against Medical Advice | Payer: Medicaid Other

## 2016-10-14 NOTE — ED Triage Notes (Signed)
Unable to find pt for triage 

## 2016-10-14 NOTE — ED Notes (Signed)
Called for triage no answer  

## 2016-10-14 NOTE — ED Triage Notes (Signed)
Called X 1 for triage.

## 2016-10-14 NOTE — ED Notes (Signed)
Called for triage. 

## 2016-10-14 NOTE — ED Triage Notes (Signed)
FIRST NURSE NOTE-c/o abdominal pain. No vomiting. Ambulatory. NAD

## 2016-11-12 ENCOUNTER — Encounter: Payer: Self-pay | Admitting: Emergency Medicine

## 2016-11-12 ENCOUNTER — Other Ambulatory Visit: Payer: Self-pay

## 2016-11-12 ENCOUNTER — Ambulatory Visit
Admission: EM | Admit: 2016-11-12 | Discharge: 2016-11-12 | Disposition: A | Discharge status: Home / Self Care | Payer: Medicaid Other | Attending: Family Medicine | Admitting: Family Medicine

## 2016-11-12 DIAGNOSIS — N760 Acute vaginitis: Secondary | ICD-10-CM | POA: Diagnosis not present

## 2016-11-12 DIAGNOSIS — N898 Other specified noninflammatory disorders of vagina: Secondary | ICD-10-CM | POA: Diagnosis not present

## 2016-11-12 DIAGNOSIS — Z113 Encounter for screening for infections with a predominantly sexual mode of transmission: Secondary | ICD-10-CM | POA: Diagnosis not present

## 2016-11-12 DIAGNOSIS — B9689 Other specified bacterial agents as the cause of diseases classified elsewhere: Secondary | ICD-10-CM | POA: Diagnosis not present

## 2016-11-12 LAB — CHLAMYDIA/NGC RT PCR (ARMC ONLY)
CHLAMYDIA TR: NOT DETECTED
N GONORRHOEAE: NOT DETECTED

## 2016-11-12 LAB — WET PREP, GENITAL
Sperm: NONE SEEN
Trich, Wet Prep: NONE SEEN
YEAST WET PREP: NONE SEEN

## 2016-11-12 MED ORDER — METRONIDAZOLE 500 MG PO TABS: 500.0000 mg | ORAL_TABLET | Freq: Two times a day (BID) | ORAL | 0 refills | Status: DC

## 2016-11-12 MED ORDER — FLUCONAZOLE 150 MG PO TABS: 150.0000 mg | ORAL_TABLET | Freq: Every day | ORAL | 0 refills | Status: DC

## 2016-11-12 NOTE — Discharge Instructions (Signed)
Take medication as prescribed. Pelvic rest. No alcohol with medication.   Follow up with your primary care physician this week as needed. Return to Urgent care for new or worsening concerns.

## 2016-11-12 NOTE — ED Provider Notes (Signed)
[facility] ____________________________________________  Time seen: Approximately 6:50 PM  I have reviewed the triage vital signs and the nursing notes.   HISTORY  Chief Complaint Vaginal Discharge   HPI Stephanie Faulkner is a 31 y.o. female presented for evaluation of approximately 1 week of vaginal discharge.  States initially she thought she was having some discharge prior to starting her menstrual this upcoming week, but reports she began to have slight irritation and itching.  Denies vaginal pain or atypical bleeding.  Denies concerns of pregnancy.  States she is currently intermittently sexually active with a new partner that is been present for approximately 2 months.  States not highly concerned for STDs, but would like to have all STD testing performed including gonorrhea,  chlamydia, trichomonas, HIV, RPR, herpes and hepatitis.  Denies associated dysuria, abdominal pain, back pain, fevers or other complaints.  Reports otherwise feels well.  Denies other complaints.  No over-the-counter medication taken prior to arrival. Denies chest pain, shortness of breath, abdominal pain, or rash. Denies recent sickness. Denies recent antibiotic use.   Patient's last menstrual period was 10/21/2016 (exact date).Denies pregnancy.    Past Medical History:  Diagnosis Date  . Abortion   . Anxiety   . History of D&C     There are no active problems to display for this patient.   Past Surgical History:  Procedure Laterality Date  . CERVICAL CONE BIOPSY       No current facility-administered medications for this encounter.   Current Outpatient Medications:  Marland Kitchen.  Multiple Vitamin (MULTIVITAMIN WITH MINERALS) TABS tablet, Take 1 tablet by mouth daily., Disp: , Rfl:  .  albuterol (PROVENTIL HFA) 108 (90 Base) MCG/ACT inhaler, Inhale into the lungs., Disp: , Rfl:  .  ALPRAZolam (XANAX) 0.25 MG tablet, Take 0.25 mg by mouth daily as needed for anxiety., Disp: , Rfl:  .   fluconazole (DIFLUCAN) 150 MG tablet, Take 1 tablet (150 mg total) daily by mouth. Take one pill orally, as needed., Disp: 2 tablet, Rfl: 0 .  metroNIDAZOLE (FLAGYL) 500 MG tablet, Take 1 tablet (500 mg total) 2 (two) times daily by mouth., Disp: 14 tablet, Rfl: 0  Allergies Patient has no known allergies.  Family History  Problem Relation Age of Onset  . Diabetes Mother   . Hypertension Mother     Social History Social History   Tobacco Use  . Smoking status: Never Smoker  . Smokeless tobacco: Never Used  Substance Use Topics  . Alcohol use: No  . Drug use: Yes    Types: Marijuana    Comment: occasionally     Review of Systems Constitutional: No fever/chills Cardiovascular: Denies chest pain. Respiratory: Denies shortness of breath. Gastrointestinal: No abdominal pain.  No nausea, no vomiting.   Genitourinary: Negative for dysuria. As above.  Musculoskeletal: Negative for back pain. Skin: Negative for rash.  ____________________________________________   PHYSICAL EXAM:  VITAL SIGNS: ED Triage Vitals  Enc Vitals Group     BP 11/12/16 1703 137/79     Pulse Rate 11/12/16 1703 82     Resp 11/12/16 1703 16     Temp 11/12/16 1703 99 F (37.2 C)     Temp Source 11/12/16 1703 Oral     SpO2 11/12/16 1703 100 %     Weight 11/12/16 1701 138 lb 7.2 oz (62.8 kg)     Height 11/12/16 1701 5\' 7"  (1.702 m)     Head Circumference --      Peak Flow --  Pain Score 11/12/16 1701 0     Pain Loc --      Pain Edu? --      Excl. in GC? --     Constitutional: Alert and oriented. Well appearing and in no acute distress. Cardiovascular: Normal rate, regular rhythm. Grossly normal heart sounds.  Good peripheral circulation. Respiratory: Normal respiratory effort without tachypnea nor retractions. Breath sounds are clear and equal bilaterally. No wheezes, rales, rhonchi. Gastrointestinal: Soft and nontender. No CVA tenderness. Musculoskeletal:  No midline cervical, thoracic or  lumbar tenderness to palpation.  Neurologic:  Normal speech and language.Speech is normal. No gait instability.  Skin:  Skin is warm, dry Psychiatric: Mood and affect are normal. Speech and behavior are normal. Patient exhibits appropriate insight and judgment   ___________________________________________   LABS (all labs ordered are listed, but only abnormal results are displayed)  Labs Reviewed  WET PREP, GENITAL - Abnormal; Notable for the following components:      Result Value   Clue Cells Wet Prep HPF POC FEW (*)    WBC, Wet Prep HPF POC MODERATE (*)    All other components within normal limits  CHLAMYDIA/NGC RT PCR ([facility] ONLY)  HIV ANTIBODY (ROUTINE TESTING)  RPR  HSV(HERPES SIMPLEX VRS) I + II AB-IGG  HSV(HERPES SIMPLEX VRS) I + II AB-IGM  HEPATITIS PANEL, ACUTE   PROCEDURES Procedures   INITIAL IMPRESSION / ASSESSMENT AND PLAN / ED COURSE  Pertinent labs & imaging results that were available during my care of the patient were reviewed by me and considered in my medical decision making (see chart for details).  Well-appearing patient.  No acute distress.  Discussed with patient pelvic exam versus wet prep, patient elected to have self wet prep and gonorrhea and chlamydia swabs.  Results reviewed, positive for clue cells.  Will treat patient with oral Flagyl and Rx given for Diflucan as needed.  Will await other results including gonorrhea, chlamydia, HIV, RPR, herpes and hepatitis.  Discussed pelvic rest.  Discussed alcohol with antibiotic.  Also counseled regarding safe sexual practices.Discussed indication, risks and benefits of medications with patient.  Discussed follow up with Primary care physician this week. Discussed follow up and return parameters including no resolution or any worsening concerns. Patient verbalized understanding and agreed to plan.   ____________________________________________   FINAL CLINICAL IMPRESSION(S) / ED DIAGNOSES  Final  diagnoses:  BV (bacterial vaginosis)  Acute vaginitis     ED Discharge Orders        Ordered    metroNIDAZOLE (FLAGYL) 500 MG tablet  2 times daily     11/12/16 1745    fluconazole (DIFLUCAN) 150 MG tablet  Daily     11/12/16 1745       Note: This dictation was prepared with Dragon dictation along with smaller phrase technology. Any transcriptional errors that result from this process are unintentional.        Renford DillsMiller, Lateia Fraser, NP 11/12/16 1931

## 2016-11-12 NOTE — ED Triage Notes (Signed)
Patient c/o vaginal itching and discharge that started a week ago.

## 2016-11-14 LAB — HEPATITIS PANEL, ACUTE
HEP A IGM: NEGATIVE
Hep B C IgM: NEGATIVE
Hepatitis B Surface Ag: NEGATIVE

## 2016-11-14 LAB — HSV(HERPES SIMPLEX VRS) I + II AB-IGM: HSVI/II Comb IgM: 1.51 Ratio — ABNORMAL HIGH (ref 0.00–0.90)

## 2016-11-14 LAB — HSV(HERPES SIMPLEX VRS) I + II AB-IGG
HSV 1 Glycoprotein G Ab, IgG: 7.01 index — ABNORMAL HIGH (ref 0.00–0.90)
HSV 2 GLYCOPROTEIN G AB, IGG: 9.37 {index} — AB (ref 0.00–0.90)

## 2016-11-14 LAB — RPR: RPR Ser Ql: NONREACTIVE

## 2016-11-14 LAB — HIV ANTIBODY (ROUTINE TESTING W REFLEX): HIV SCREEN 4TH GENERATION: NONREACTIVE

## 2016-11-15 ENCOUNTER — Telehealth: Payer: Self-pay

## 2016-11-15 MED ORDER — VALACYCLOVIR HCL 1 G PO TABS: 1000.0000 mg | ORAL_TABLET | Freq: Two times a day (BID) | ORAL | 0 refills | Status: AC

## 2017-01-14 LAB — HM HIV SCREENING LAB: HM HIV Screening: NEGATIVE

## 2017-02-18 ENCOUNTER — Ambulatory Visit
Admission: EM | Admit: 2017-02-18 | Discharge: 2017-02-18 | Disposition: A | Discharge status: Home / Self Care | Payer: Medicaid Other | Attending: Family Medicine | Admitting: Family Medicine

## 2017-02-18 ENCOUNTER — Other Ambulatory Visit: Payer: Self-pay

## 2017-02-18 DIAGNOSIS — R0981 Nasal congestion: Secondary | ICD-10-CM | POA: Diagnosis present

## 2017-02-18 DIAGNOSIS — Z8249 Family history of ischemic heart disease and other diseases of the circulatory system: Secondary | ICD-10-CM | POA: Insufficient documentation

## 2017-02-18 DIAGNOSIS — Z833 Family history of diabetes mellitus: Secondary | ICD-10-CM | POA: Insufficient documentation

## 2017-02-18 DIAGNOSIS — J069 Acute upper respiratory infection, unspecified: Secondary | ICD-10-CM | POA: Diagnosis not present

## 2017-02-18 DIAGNOSIS — Z3201 Encounter for pregnancy test, result positive: Secondary | ICD-10-CM | POA: Insufficient documentation

## 2017-02-18 DIAGNOSIS — R05 Cough: Secondary | ICD-10-CM

## 2017-02-18 DIAGNOSIS — B9789 Other viral agents as the cause of diseases classified elsewhere: Secondary | ICD-10-CM | POA: Diagnosis not present

## 2017-02-18 LAB — PREGNANCY, URINE: Preg Test, Ur: POSITIVE — AB

## 2017-02-18 MED ORDER — PRENATAL 28-0.8 MG PO TABS: ORAL_TABLET | ORAL | 3 refills | Status: DC

## 2017-02-18 NOTE — Discharge Instructions (Signed)
Rest, fluids.  [facility].  Tylenol as needed.  Take care  Dr. Adriana Simasook

## 2017-02-18 NOTE — ED Provider Notes (Signed)
[facility]   CSN: [id number] Arrival date & time: 02/18/17  1000  History   Chief Complaint Chief Complaint  Patient presents with  . Nasal Congestion   HPI  32 year old female presents with upper respiratory symptoms.  Patient reports that she is been sick since Friday.  She has had subjective fever, cough, chest tightness, chills, runny nose.  She states that her cough is severe and interfering with sleep.  No reported sick contacts.  She is been taking over-the-counter medications without resolution.  No other associated symptoms.  Additionally, patient states that she is 2 days late for her period.  She is concerned that she is pregnant.  She would like a pregnancy test today.  Past Medical History:  Diagnosis Date  . Abortion   . Anxiety   . History of D&C    Past Surgical History:  Procedure Laterality Date  . CERVICAL CONE BIOPSY      OB History    No data available     Home Medications    Prior to Admission medications   Medication Sig Start Date End Date Taking? Authorizing Provider  PRENATAL 28-0.8 MG TABS 1 tablet daily. 02/18/17   [name], Jaber Dunlow G, DO   Family History Family History  Problem Relation Age of Onset  . Diabetes Mother   . Hypertension Mother    Social History Social History   Tobacco Use  . Smoking status: Never Smoker  . Smokeless tobacco: Never Used  Substance Use Topics  . Alcohol use: No  . Drug use: Yes    Types: Marijuana    Comment: occasionally    Allergies   Patient has no known allergies.   Review of Systems Review of Systems  Constitutional: Positive for fever.  HENT: Positive for rhinorrhea.   Respiratory: Positive for cough and chest tightness.    Physical Exam Triage Vital Signs ED Triage Vitals  Enc Vitals Group     BP 02/18/17 1029 138/89     Pulse Rate 02/18/17 1029 91     Resp 02/18/17 1029 18     Temp 02/18/17 1029 100.1 F (37.8 C)     Temp Source 02/18/17 1029 Oral     SpO2 02/18/17  1029 100 %     Weight 02/18/17 1029 139 lb (63 kg)     Height 02/18/17 1029 5\' 7"  (1.702 m)     Head Circumference --      Peak Flow --      Pain Score 02/18/17 1140 0     Pain Loc --      Pain Edu? --      Excl. in GC? --    Updated Vital Signs BP 138/89 (BP Location: Left Arm)   Pulse 91   Temp 100.1 F (37.8 C) (Oral)   Resp 18   Ht 5\' 7"  (1.702 m)   Wt 139 lb (63 kg)   LMP 01/24/2017   SpO2 100%   BMI 21.77 kg/m     Physical Exam  Constitutional: She is oriented to person, place, and time. She appears well-developed. No distress.  HENT:  Head: Normocephalic and atraumatic.  Eyes: Conjunctivae are normal.  Cardiovascular: Normal rate and regular rhythm.  Pulmonary/Chest: Effort normal and breath sounds normal. She has no wheezes. She has no rales.  Neurological: She is alert and oriented to person, place, and time.  Psychiatric: She has a normal mood and affect. Her behavior is normal.  Nursing note and vitals reviewed.  UC Treatments / Results  Labs (all labs ordered are listed, but only abnormal results are displayed) Labs Reviewed  PREGNANCY, URINE - Abnormal; Notable for the following components:      Result Value   Preg Test, Ur POSITIVE (*)    All other components within normal limits    EKG  EKG Interpretation None       Radiology No results found.  Procedures Procedures (including critical care time)  Medications Ordered in UC Medications - No data to display   Initial Impression / Assessment and Plan / UC Course  I have reviewed the triage vital signs and the nursing notes.  Pertinent labs & imaging results that were available during my care of the patient were reviewed by me and considered in my medical decision making (see chart for details).     32 year old female presents with a viral upper respiratory infection.  I advised supportive care and over-the-counter Tylenol as needed.  She is currently pregnant.  She had a positive  pregnancy test today.  I advised her to see 1 of our local GYNs.  I gave her their names/information.  Patient requested a prenatal vitamin.  I prescribed one today.  Final Clinical Impressions(s) / UC Diagnoses   Final diagnoses:  Viral URI with cough  Positive pregnancy test    ED Discharge Orders        Ordered    PRENATAL 28-0.8 MG TABS     02/18/17 1137     Controlled Substance Prescriptions [place] Controlled Substance Registry consulted? Not Applicable   Tommie Sams, DO 02/18/17 1214

## 2017-02-18 NOTE — ED Triage Notes (Addendum)
Pt with "cold" sx x one week. Severely runny nose and eyes watering. Low grade fever. Also states she is two days late on her period although she states her last period Jan. 24th

## 2017-02-23 ENCOUNTER — Emergency Department
Admission: EM | Admit: 2017-02-23 | Discharge: 2017-02-23 | Disposition: A | Discharge status: Home / Self Care | Payer: Medicaid Other | Attending: Emergency Medicine | Admitting: Emergency Medicine

## 2017-02-23 ENCOUNTER — Other Ambulatory Visit: Payer: Self-pay

## 2017-02-23 DIAGNOSIS — O200 Threatened abortion: Secondary | ICD-10-CM | POA: Diagnosis not present

## 2017-02-23 DIAGNOSIS — O26851 Spotting complicating pregnancy, first trimester: Secondary | ICD-10-CM | POA: Diagnosis present

## 2017-02-23 DIAGNOSIS — Z3A00 Weeks of gestation of pregnancy not specified: Secondary | ICD-10-CM | POA: Diagnosis not present

## 2017-02-23 LAB — HCG, QUANTITATIVE, PREGNANCY: HCG, BETA CHAIN, QUANT, S: 5 m[IU]/mL — AB (ref <5)

## 2017-02-23 LAB — POCT PREGNANCY, URINE: PREG TEST UR: NEGATIVE

## 2017-02-23 NOTE — ED Provider Notes (Signed)
[facility] Emergency Department Provider Note   ____________________________________________   First MD Initiated Contact with Patient 02/23/17 1357     (approximate)  I have reviewed the triage vital signs and the nursing notes.   HISTORY  Chief Complaint Vaginal Bleeding    HPI Stephanie Faulkner is a 32 y.o. female evaluation for vaginal bleeding.  The patient reports she was recently diagnosed as extremely early pregnancy, last menstrual cycle about January 29.  She was seen in urgent care for "head cold" and they incidentally noted her pregnancy test was positive.  She reports she had 2 blood test since that time, [facility] and they told her blood count was dropping and they were concerned that she could be at risk of having a miscarriage.  This morning she noticed blood in her underwear when she is getting up, and she is also had some very light bleeding in the passage of some very small purple clots this morning.  No nausea or vomiting.  Not having any abdominal pain at present though she had some slight.  Leg cramps earlier.  Reports no history of ectopic pregnancy.  Not any fertility treatments.  Currently reports no pain.  No ongoing bleeding.  Reports bleeding was very light, similar to that of her menstrual period.  Past Medical History:  Diagnosis Date  . Abortion   . Anxiety   . History of D&C     There are no active problems to display for this patient.   Past Surgical History:  Procedure Laterality Date  . CERVICAL CONE BIOPSY      Prior to Admission medications   Medication Sig Start Date End Date Taking? Authorizing Provider  PRENATAL 28-0.8 MG TABS 1 tablet daily. 02/18/17   [name], [name], DO    Allergies Patient has no known allergies.  Family History  Problem Relation Age of Onset  . Diabetes Mother   . Hypertension Mother     Social History Social History   Tobacco Use  .  Smoking status: Never Smoker  . Smokeless tobacco: Never Used  Substance Use Topics  . Alcohol use: No  . Drug use: Yes    Types: Marijuana    Comment: occasionally     Review of Systems Constitutional: No fever/chills Eyes: No visual changes. ENT: No sore throat. Cardiovascular: Denies chest pain. Respiratory: Denies shortness of breath. Gastrointestinal: No nausea or vomiting.  No diarrhea.  No constipation. Genitourinary: Negative for dysuria. Musculoskeletal: Negative for back pain. Skin: Negative for rash. Neurological: Negative for headaches.    ____________________________________________   PHYSICAL EXAM:  VITAL SIGNS: ED Triage Vitals  Enc Vitals Group     BP 02/23/17 1119 138/81     Pulse Rate 02/23/17 1119 (!) 56     Resp 02/23/17 1119 17     Temp 02/23/17 1119 99 F (37.2 C)     Temp Source 02/23/17 1119 Oral     SpO2 02/23/17 1119 100 %     Weight 02/23/17 1120 139 lb (63 kg)     Height 02/23/17 1120 5\' 7"  (1.702 m)     Head Circumference --      Peak Flow --      Pain Score 02/23/17 1120 0     Pain Loc --      Pain Edu? --      Excl. in GC? --     Constitutional: Alert and oriented. Well appearing and  in no acute distress. Eyes: Conjunctivae are normal. Head: Atraumatic. Nose: No congestion/rhinnorhea. Mouth/Throat: Mucous membranes are moist. Neck: No stridor.   Cardiovascular: Normal rate, regular rhythm. Grossly normal heart sounds.  Good peripheral circulation. Respiratory: Normal respiratory effort.  No retractions. Lungs CTAB. Gastrointestinal: Soft and nontender. No distention.  There is no rebound or guarding in any quadrant.  There is no distention. Musculoskeletal: No lower extremity tenderness nor edema. Neurologic:  Normal speech and language. No gross focal neurologic deficits are appreciated.  Skin:  Skin is warm, dry and intact. No rash noted. Psychiatric: Mood and affect are normal. Speech and behavior are  normal.  ____________________________________________   LABS (all labs ordered are listed, but only abnormal results are displayed)  Labs Reviewed  HCG, QUANTITATIVE, PREGNANCY - Abnormal; Notable for the following components:      Result Value   hCG, Beta Chain, Quant, S 5 (*)    All other components within normal limits  POCT PREGNANCY, URINE   ____________________________________________  EKG   ____________________________________________  RADIOLOGY   ____________________________________________   PROCEDURES  Procedure(s) performed: None  Procedures  Critical Care performed: No  ____________________________________________   INITIAL IMPRESSION / ASSESSMENT AND PLAN / ED COURSE  Pertinent labs & imaging results that were available during my care of the patient were reviewed by me and considered in my medical decision making (see chart for details).  Care Everywhere Result Report HCG Blood Quantitative, Pregnancy Test2/22/2019 [facility] Component Name Value Ref Range  HCG Quant, Serum Pregnancy 9 mIU/mL  Specimen  Blood  Result Narrative  Interpretive Data :  Adult Female: <5 mIU/mL  Adult Female:  Negative <5 mIU/mL Indeterminate5-25 mIU/mL Positive >12mIU/mL     Today's hCG is noted to be 5.  Her hCG is continued to trend downward.  At this point her symptoms seem to be improving, no notable pain.  Reassuring exam.  No notable risk factors for ectopic pregnancy.  Discussed with the patient, and offered ultrasound to evaluate and assure no signs or symptoms of an ectopic pregnancy on ultrasound/imaging, and discussed with her also other option of close observation and the emergency room reevaluation if she is to develop pain, heavy bleeding, or other concerns arise  in the interim as she was set up in the follow-up visit with her primary doctor this week.  After discussing with the patient and her husband, and consideration of her essentially asymptomatic status at this point the patient would like to be discharged and return if she experiences pain, concerning for worsening bleeding, vomiting, weakness lightheadedness or other new concerns arise.  This is very reasonable, at this point her hCG is downtrending and remains threatened miscarriage with close PCP follow-up.  Review of care everywhere records from [facility] health note the patient is a positive blood type.      ____________________________________________   FINAL CLINICAL IMPRESSION(S) / ED DIAGNOSES  Final diagnoses:  Threatened miscarriage in early pregnancy      NEW MEDICATIONS STARTED DURING THIS VISIT:  New Prescriptions   No medications on file     Note:  This document was prepared using Dragon voice recognition software and may include unintentional dictation errors.     Sharyn Creamer, MD 02/23/17 [phone]

## 2017-02-23 NOTE — ED Triage Notes (Signed)
Pt states she had a confirmed [facility] this week and today having mild cramping and bleeding when she wipes.

## 2017-02-23 NOTE — Discharge Instructions (Signed)
Please follow up closely with your primary doctor.  Return to the emergency room if your bleeding worsens, you become weak and dizzy or lightheaded, you have an episode of passing out, develop severe bleeding such as more than 1 soaked pad per hour for more than 3 straight hours, develop abdominal or pelvic pain, fevers chills or other new concerns arise.

## 2017-05-06 ENCOUNTER — Emergency Department
Admission: EM | Admit: 2017-05-06 | Discharge: 2017-05-06 | Disposition: A | Discharge status: Home / Self Care | Payer: Medicaid Other | Attending: Emergency Medicine | Admitting: Emergency Medicine

## 2017-05-06 ENCOUNTER — Encounter: Payer: Self-pay | Admitting: Emergency Medicine

## 2017-05-06 ENCOUNTER — Other Ambulatory Visit: Payer: Self-pay

## 2017-05-06 DIAGNOSIS — B9689 Other specified bacterial agents as the cause of diseases classified elsewhere: Secondary | ICD-10-CM | POA: Insufficient documentation

## 2017-05-06 DIAGNOSIS — B373 Candidiasis of vulva and vagina: Secondary | ICD-10-CM | POA: Insufficient documentation

## 2017-05-06 DIAGNOSIS — Z79899 Other long term (current) drug therapy: Secondary | ICD-10-CM | POA: Insufficient documentation

## 2017-05-06 DIAGNOSIS — Z3A01 Less than 8 weeks gestation of pregnancy: Secondary | ICD-10-CM | POA: Insufficient documentation

## 2017-05-06 DIAGNOSIS — N898 Other specified noninflammatory disorders of vagina: Secondary | ICD-10-CM | POA: Diagnosis present

## 2017-05-06 DIAGNOSIS — B3731 Acute candidiasis of vulva and vagina: Secondary | ICD-10-CM

## 2017-05-06 DIAGNOSIS — N760 Acute vaginitis: Secondary | ICD-10-CM

## 2017-05-06 DIAGNOSIS — O23591 Infection of other part of genital tract in pregnancy, first trimester: Secondary | ICD-10-CM | POA: Insufficient documentation

## 2017-05-06 LAB — WET PREP, GENITAL
SPERM: NONE SEEN
TRICH WET PREP: NONE SEEN

## 2017-05-06 LAB — URINALYSIS, ROUTINE W REFLEX MICROSCOPIC
BILIRUBIN URINE: NEGATIVE
GLUCOSE, UA: NEGATIVE mg/dL
HGB URINE DIPSTICK: NEGATIVE
Ketones, ur: NEGATIVE mg/dL
Leukocytes, UA: NEGATIVE
Nitrite: NEGATIVE
PH: 7 (ref 5.0–8.0)
Protein, ur: NEGATIVE mg/dL
SPECIFIC GRAVITY, URINE: 1.013 (ref 1.005–1.030)

## 2017-05-06 LAB — POCT PREGNANCY, URINE: Preg Test, Ur: NEGATIVE

## 2017-05-06 LAB — CHLAMYDIA/NGC RT PCR (ARMC ONLY)
Chlamydia Tr: NOT DETECTED
N gonorrhoeae: NOT DETECTED

## 2017-05-06 LAB — HCG, QUANTITATIVE, PREGNANCY: hCG, Beta Chain, Quant, S: 60 m[IU]/mL — ABNORMAL HIGH (ref <5)

## 2017-05-06 MED ORDER — METRONIDAZOLE 500 MG PO TABS: 500.0000 mg | ORAL_TABLET | Freq: Two times a day (BID) | ORAL | 0 refills | Status: DC

## 2017-05-06 NOTE — ED Provider Notes (Signed)
[facility] Emergency Department Provider Note  ____________________________________________   First MD Initiated Contact with Patient 05/06/17 1337     (approximate)  I have reviewed the triage vital signs and the nursing notes.   HISTORY  Chief Complaint Vaginal Discharge   HPI Stephanie Faulkner is a 32 y.o. female is here with complaint of vaginal discharge for 1 week.  Patient states that she also may be pregnant.  She denies any vaginal pain or abdominal pain.  She states she has an appointment with her doctor but cannot wait another week.  She denies any dysuria or back pain.  There is been no fever, chills, nausea or vomiting.  Past Medical History:  Diagnosis Date  . Abortion   . Anxiety   . History of D&C     There are no active problems to display for this patient.   Past Surgical History:  Procedure Laterality Date  . CERVICAL CONE BIOPSY      Prior to Admission medications   Medication Sig Start Date End Date Taking? Authorizing Provider  metroNIDAZOLE (FLAGYL) 500 MG tablet Take 1 tablet (500 mg total) by mouth 2 (two) times daily. 05/06/17   Tommi Rumps, PA-C  PRENATAL 28-0.8 MG TABS 1 tablet daily. 02/18/17   Tommie Sams, DO    Allergies Patient has no known allergies.  Family History  Problem Relation Age of Onset  . Diabetes Mother   . Hypertension Mother     Social History Social History   Tobacco Use  . Smoking status: Never Smoker  . Smokeless tobacco: Never Used  Substance Use Topics  . Alcohol use: No  . Drug use: Yes    Types: Marijuana    Comment: occasionally     Review of Systems Constitutional: No fever/chills Cardiovascular: Denies chest pain. Respiratory: Denies shortness of breath. Gastrointestinal: No abdominal pain.  No nausea, no vomiting.   Genitourinary: Negative for dysuria.  Positive for vaginal discharge. Musculoskeletal: Negative for back pain. Skin: Negative for  rash. Neurological: Negative for headaches, focal weakness or numbness. ____________________________________________   PHYSICAL EXAM:  VITAL SIGNS: ED Triage Vitals [05/06/17 1137]  Enc Vitals Group     BP 125/79     Pulse Rate 76     Resp 14     Temp 98.6 F (37 C)     Temp Source Oral     SpO2 100 %     Weight 141 lb (64 kg)     Height  (1.702 m)     Head Circumference      Peak Flow      Pain Score 2     Pain Loc      Pain Edu?      Excl. in GC?     Constitutional: Alert and oriented. Well appearing and in no acute distress. Eyes: Conjunctivae are normal.  Head: Atraumatic. Neck: No stridor.   Cardiovascular: Normal rate, regular rhythm. Grossly normal heart sounds.  Good peripheral circulation. Respiratory: Normal respiratory effort.  No retractions. Lungs CTAB. Gastrointestinal: Soft and nontender. No distention.  Genitourinary: Thick white vaginal secretions are noted along the vaginal wall.  No adnexal masses or tenderness was noted.  No cervical motion tenderness present.  External genitalia unremarkable. Musculoskeletal: Moves upper and lower extremities without any difficulty.  Normal gait was noted. Neurologic:  Normal speech and language. No gross focal neurologic deficits are appreciated.  Skin:  Skin is warm, dry and intact. No rash noted.  Psychiatric: Mood and affect are normal. Speech and behavior are normal.  ____________________________________________   LABS (all labs ordered are listed, but only abnormal results are displayed)  Labs Reviewed  WET PREP, GENITAL - Abnormal; Notable for the following components:      Result Value   Yeast Wet Prep HPF POC PRESENT (*)    Clue Cells Wet Prep HPF POC PRESENT (*)    WBC, Wet Prep HPF POC MODERATE (*)    All other components within normal limits  URINALYSIS, ROUTINE W REFLEX MICROSCOPIC - Abnormal; Notable for the following components:   Color, Urine YELLOW (*)    APPearance CLEAR (*)    All  other components within normal limits  HCG, QUANTITATIVE, PREGNANCY - Abnormal; Notable for the following components:   hCG, Beta Chain, Quant, S 60 (*)    All other components within normal limits  CHLAMYDIA/NGC RT PCR ([facility] ONLY)  POC URINE PREG, ED  POCT PREGNANCY, URINE    PROCEDURES  Procedure(s) performed: None  Procedures  Critical Care performed: No  ____________________________________________   INITIAL IMPRESSION / ASSESSMENT AND PLAN / ED COURSE  As part of my medical decision making, I reviewed the following data within the electronic MEDICAL RECORD NUMBER Notes from prior ED visits and [place] Controlled Substance Database  Patient was made aware that beta-hCG was positive.  Patient also was given a prescription for Flagyl 500 mg twice daily for 7 days for her back total vaginosis.  At this time she is asymptomatic with her yeast vaginosis.  She is aware that she can use Monistat externally if needed for itching.  She is to follow-up with [facility] department or the OB/GYN listed on her discharge papers for continued prenatal care. ____________________________________________   FINAL CLINICAL IMPRESSION(S) / ED DIAGNOSES  Final diagnoses:  BV (bacterial vaginosis)  Yeast vaginitis  Less than [redacted] weeks gestation of pregnancy     ED Discharge Orders        Ordered    metroNIDAZOLE (FLAGYL) 500 MG tablet  2 times daily     05/06/17 1502       Note:  This document was prepared using Dragon voice recognition software and may include unintentional dictation errors.    Tommi Rumps, PA-C 05/06/17 1543    Minna Antis, MD 05/08/17 [phone]

## 2017-05-06 NOTE — Discharge Instructions (Addendum)
Call make an appointment with [facility] department for prenatal care or the OB/GYN that is listed on your discharge papers.  Take Flagyl 500 mg twice daily for 7 days for your bacterial vaginosis.  Do not drink alcohol with this medication or during your pregnancy.  If you become symptomatic and began itching you can use over-the-counter Monistat for yeast infection.  Avoid taken anti-inflammatories.

## 2017-05-06 NOTE — ED Triage Notes (Signed)
Noticed vaginal discharge last week .  Her doctor does nothave appt til next week. Also thinks maybe pregnant.  A little pain in low mid abd.

## 2017-05-21 ENCOUNTER — Emergency Department
Admission: EM | Admit: 2017-05-21 | Discharge: 2017-05-21 | Disposition: A | Discharge status: Home / Self Care | Payer: Medicaid Other | Attending: Emergency Medicine | Admitting: Emergency Medicine

## 2017-05-21 ENCOUNTER — Other Ambulatory Visit: Payer: Self-pay

## 2017-05-21 ENCOUNTER — Encounter: Payer: Self-pay | Admitting: Emergency Medicine

## 2017-05-21 DIAGNOSIS — O99340 Other mental disorders complicating pregnancy, unspecified trimester: Secondary | ICD-10-CM | POA: Insufficient documentation

## 2017-05-21 DIAGNOSIS — O210 Mild hyperemesis gravidarum: Secondary | ICD-10-CM | POA: Diagnosis not present

## 2017-05-21 DIAGNOSIS — F419 Anxiety disorder, unspecified: Secondary | ICD-10-CM | POA: Insufficient documentation

## 2017-05-21 DIAGNOSIS — Z3A00 Weeks of gestation of pregnancy not specified: Secondary | ICD-10-CM | POA: Insufficient documentation

## 2017-05-21 DIAGNOSIS — O219 Vomiting of pregnancy, unspecified: Secondary | ICD-10-CM | POA: Diagnosis present

## 2017-05-21 LAB — URINALYSIS, COMPLETE (UACMP) WITH MICROSCOPIC
Bilirubin Urine: NEGATIVE
GLUCOSE, UA: NEGATIVE mg/dL
Hgb urine dipstick: NEGATIVE
KETONES UR: 80 mg/dL — AB
Leukocytes, UA: NEGATIVE
Nitrite: NEGATIVE
PROTEIN: 100 mg/dL — AB
Specific Gravity, Urine: 1.034 — ABNORMAL HIGH (ref 1.005–1.030)
pH: 6 (ref 5.0–8.0)

## 2017-05-21 LAB — CBC
HCT: 40.6 % (ref 35.0–47.0)
Hemoglobin: 14 g/dL (ref 12.0–16.0)
MCH: 32.4 pg (ref 26.0–34.0)
MCHC: 34.6 g/dL (ref 32.0–36.0)
MCV: 93.7 fL (ref 80.0–100.0)
Platelets: 316 10*3/uL (ref 150–440)
RBC: 4.33 MIL/uL (ref 3.80–5.20)
RDW: 12.6 % (ref 11.5–14.5)
WBC: 7.5 10*3/uL (ref 3.6–11.0)

## 2017-05-21 LAB — COMPREHENSIVE METABOLIC PANEL
ALT: 16 U/L (ref 14–54)
AST: 23 U/L (ref 15–41)
Albumin: 4.3 g/dL (ref 3.5–5.0)
Alkaline Phosphatase: 60 U/L (ref 38–126)
Anion gap: 10 (ref 5–15)
BILIRUBIN TOTAL: 0.9 mg/dL (ref 0.3–1.2)
BUN: 7 mg/dL (ref 6–20)
CALCIUM: 9.7 mg/dL (ref 8.9–10.3)
CHLORIDE: 104 mmol/L (ref 101–111)
CO2: 25 mmol/L (ref 22–32)
Creatinine, Ser: 0.7 mg/dL (ref 0.44–1.00)
GFR calc Af Amer: >60 mL/min (ref >60)
Glucose, Bld: 83 mg/dL (ref 65–99)
Potassium: 3.5 mmol/L (ref 3.5–5.1)
Sodium: 139 mmol/L (ref 135–145)
TOTAL PROTEIN: 8.1 g/dL (ref 6.5–8.1)

## 2017-05-21 LAB — HCG, QUANTITATIVE, PREGNANCY: HCG, BETA CHAIN, QUANT, S: 50184 m[IU]/mL — AB (ref <5)

## 2017-05-21 LAB — ABO/RH: ABO/RH(D): A POS

## 2017-05-21 LAB — LIPASE, BLOOD: Lipase: 20 U/L (ref 11–51)

## 2017-05-21 MED ORDER — SODIUM CHLORIDE 0.9 % IV BOLUS
1000.0000 mL | Freq: Once | INTRAVENOUS | Status: AC
  Administered 2017-05-21: 1000 mL via INTRAVENOUS

## 2017-05-21 MED ORDER — PROMETHAZINE HCL 25 MG RE SUPP: 25.0000 mg | Freq: Four times a day (QID) | RECTAL | 1 refills | Status: DC | PRN

## 2017-05-21 MED ORDER — SODIUM CHLORIDE 0.9 % IV BOLUS: 1000.0000 mL | Freq: Once | INTRAVENOUS | Status: DC

## 2017-05-21 MED ORDER — PROMETHAZINE HCL 25 MG/ML IJ SOLN
12.5000 mg | Freq: Once | INTRAMUSCULAR | Status: AC
  Administered 2017-05-21: 12.5 mg via INTRAVENOUS
  Filled 2017-05-21: qty 1

## 2017-05-21 MED ORDER — DOXYLAMINE-PYRIDOXINE 10-10 MG PO TBEC: DELAYED_RELEASE_TABLET | ORAL | 1 refills | Status: DC

## 2017-05-21 MED ORDER — SODIUM CHLORIDE 0.9 % IV BOLUS
1000.0000 mL | Freq: Once | INTRAVENOUS | Status: AC
Start: 2017-05-21 — End: 2017-05-21
  Administered 2017-05-21: 1000 mL via INTRAVENOUS

## 2017-05-21 NOTE — ED Provider Notes (Addendum)
[facility] Emergency Department Provider Note  ____________________________________________   I have reviewed the triage vital signs and the nursing notes. Where available I have reviewed prior notes and, if possible and indicated, outside hospital notes.    HISTORY  Chief Complaint Emesis    HPI Cheryln Balcom is a 32 y.o. female with a history of hyperemesis gravidarum, she is G4, P3, presents today complaining of vomiting for 4 days.  This is consistent with prior hyperemesis.  She states that her last menstrual.  Was mid to late March.  She denies any fever chills or abdominal pain.  She has no vaginal bleeding no gush of fluid and [place] no pelvic symptoms.  She does not want a pelvic exam or does she want ultrasound she states she will follow-up with her OB for that, she is here for help with her nausea.  She states usually Phenergan helps, especially Phenergan PR, she has not tried anything at home for this.  She began vomiting for 5 days ago.  She denies any fever or chills.  And she has no melena or bright red blood per rectum, diarrhea, hematemesis or feculent vomiting.  Nothing makes it better nothing makes it worse, No vaginal discharge.   Past Medical History:  Diagnosis Date  . Abortion   . Anxiety   . History of D&C     There are no active problems to display for this patient.   Past Surgical History:  Procedure Laterality Date  . CERVICAL CONE BIOPSY      Prior to Admission medications   Medication Sig Start Date End Date Taking? Authorizing Provider  metroNIDAZOLE (FLAGYL) 500 MG tablet Take 1 tablet (500 mg total) by mouth 2 (two) times daily. 05/06/17   Tommi Rumps, PA-C  PRENATAL 28-0.8 MG TABS 1 tablet daily. 02/18/17   Tommie Sams, DO    Allergies Patient has no known allergies.  Family History  Problem Relation Age of Onset  . Diabetes Mother   . Hypertension Mother     Social History Social History   Tobacco  Use  . Smoking status: Never Smoker  . Smokeless tobacco: Never Used  Substance Use Topics  . Alcohol use: No  . Drug use: Yes    Types: Marijuana    Comment: occasionally     Review of Systems Constitutional: No fever/chills Eyes: No visual changes. ENT: No sore throat. No stiff neck no neck pain Cardiovascular: Denies chest pain. Respiratory: Denies shortness of breath. Gastrointestinal:   + vomiting.  No diarrhea.  No constipation. Genitourinary: Negative for dysuria. Musculoskeletal: Negative lower extremity swelling Skin: Negative for rash. Neurological: Negative for severe headaches, focal weakness or numbness.   ____________________________________________   PHYSICAL EXAM:  VITAL SIGNS: ED Triage Vitals  Enc Vitals Group     BP 05/21/17 2007 136/87     Pulse Rate 05/21/17 2007 (!) 113     Resp 05/21/17 2007 18     Temp 05/21/17 2007 99.8 F (37.7 C)     Temp Source 05/21/17 2007 Oral     SpO2 05/21/17 2007 100 %     Weight 05/21/17 2007 137 lb (62.1 kg)     Height 05/21/17 2007  (1.702 m)     Head Circumference --      Peak Flow --      Pain Score 05/21/17 2012 0     Pain Loc --      Pain Edu? --  Excl. in GC? --     Constitutional: Alert and oriented. Well appearing and in no acute distress. Eyes: Conjunctivae are normal Head: Atraumatic HEENT: No congestion/rhinnorhea. Mucous membranes are moist.  Oropharynx non-erythematous Neck:   Nontender with no meningismus, no masses, no stridor Cardiovascular: Normal rate, regular rhythm. Grossly normal heart sounds.  Good peripheral circulation. Respiratory: Normal respiratory effort.  No retractions. Lungs CTAB. Abdominal: Soft and nontender. No distention. No guarding no rebound Back:  There is no focal tenderness or step off.  there is no midline tenderness there are no lesions noted. there is no CVA tenderness Musculoskeletal: No lower extremity tenderness, no upper extremity tenderness. No joint  effusions, no DVT signs strong distal pulses no edema Neurologic:  Normal speech and language. No gross focal neurologic deficits are appreciated.  Skin:  Skin is warm, dry and intact. No rash noted. Psychiatric: Mood and affect are normal. Speech and behavior are normal.  ____________________________________________   LABS (all labs ordered are listed, but only abnormal results are displayed)  Labs Reviewed  URINALYSIS, COMPLETE (UACMP) WITH MICROSCOPIC - Abnormal; Notable for the following components:      Result Value   Color, Urine AMBER (*)    APPearance HAZY (*)    Specific Gravity, Urine 1.034 (*)    Ketones, ur 80 (*)    Protein, ur 100 (*)    Bacteria, UA FEW (*)    All other components within normal limits  HCG, QUANTITATIVE, PREGNANCY - Abnormal; Notable for the following components:   hCG, Beta Chain, Quant, S 50,184 (*)    All other components within normal limits  LIPASE, BLOOD  COMPREHENSIVE METABOLIC PANEL  CBC  ABO/RH    Pertinent labs  results that were available during my care of the patient were reviewed by me and considered in my medical decision making (see chart for details). ____________________________________________  EKG  I personally interpreted any EKGs ordered by me or triage  ____________________________________________  RADIOLOGY  Pertinent labs & imaging results that were available during my care of the patient were reviewed by me and considered in my medical decision making (see chart for details). If possible, patient and/or family made aware of any abnormal findings.  No results found. ____________________________________________    PROCEDURES  Procedure(s) performed: None  Procedures  Critical Care performed: None  ____________________________________________   INITIAL IMPRESSION / ASSESSMENT AND PLAN / ED COURSE  Pertinent labs & imaging results that were available during my care of the patient were reviewed by me and  considered in my medical decision making (see chart for details).  Vision in no acute distress abdomen is absolutely benign she has no vaginal discharge, vaginal bleeding, or pelvic pain.  Nothing consistent appendicitis, gallbladder disease, or other acute pathology except for some degree of dehydration from what is likely hyperemesis gravidarum.  I did offer her a pelvic exam and ultrasound for dates etc. and she refuses.  She states that she was planning on just following up closely with OB/GYN and she does not want that here.  Obviously we cannot compel her to do that but it does limit our ability to further evaluate her and she understands that.  Personally, I see no evidence of other acute intra-abdominal pathology and she has no vaginal discharge, she denies any symptoms of PID and again without a pelvic exam I cannot rule it out.  We have given her IV fluid here and she is feeling somewhat better trying p.o. challenge, patient will follow  closely with primary care doctor.  She is very comfortable with this plan and will return if she feels worse..  ----------------------------------------- 10:49 PM on 05/21/2017 -----------------------------------------  Patient was requesting discharge I did prevail upon her to stay for 1 more liter of fluid as she had a plus ketones in her urine.  She states that she can stay for 1 more but she would prefer not to have anything after that.  She is tolerating p.o. with no difficulty here.  We did discuss Phenergan and likely just an all of the risks of those medications and she states she is very comfortable with taking them as she has had multiple times in the form during pregnancy, in fact she states Phenergan is the only thing that works for her and if I do not give her Phenergan she will just be back    ____________________________________________   FINAL CLINICAL IMPRESSION(S) / ED DIAGNOSES  Final diagnoses:  None      This chart was dictated  using voice recognition software.  Despite best efforts to proofread,  errors can occur which can change meaning.      Jeanmarie Plant, MD 05/21/17 2242    Jeanmarie Plant, MD 05/21/17 2250

## 2017-05-21 NOTE — ED Notes (Signed)
Pt given water and sprite for PO challenge.

## 2017-05-21 NOTE — ED Triage Notes (Signed)
Patient states she is currently pregnant, but unsure of how far along she is. States last three pregnancies, she had to take Phenergan suppositories every time. Patient states she has been unable to eat for 3 days d/t N/V.

## 2017-05-21 NOTE — Discharge Instructions (Addendum)
Prefer not to have an ultrasound or pelvic exam at this time which is certainly your choice but does limit her ability to assess her pregnancy.  If you have any increased vomiting that is uncontrolled, fever, vaginal bleeding, gush of fluid from your vagina, abdominal pain or you feel worse in any way please return to the emergency room.

## 2017-06-07 ENCOUNTER — Emergency Department
Admission: EM | Admit: 2017-06-07 | Discharge: 2017-06-07 | Disposition: A | Discharge status: Home / Self Care | Payer: Medicaid Other | Attending: Emergency Medicine | Admitting: Emergency Medicine

## 2017-06-07 ENCOUNTER — Other Ambulatory Visit: Payer: Self-pay

## 2017-06-07 ENCOUNTER — Emergency Department: Payer: Medicaid Other

## 2017-06-07 DIAGNOSIS — Z8759 Personal history of other complications of pregnancy, childbirth and the puerperium: Secondary | ICD-10-CM

## 2017-06-07 DIAGNOSIS — K0889 Other specified disorders of teeth and supporting structures: Secondary | ICD-10-CM | POA: Diagnosis not present

## 2017-06-07 DIAGNOSIS — O034 Incomplete spontaneous abortion without complication: Secondary | ICD-10-CM

## 2017-06-07 DIAGNOSIS — R102 Pelvic and perineal pain: Secondary | ICD-10-CM | POA: Diagnosis present

## 2017-06-07 DIAGNOSIS — K029 Dental caries, unspecified: Secondary | ICD-10-CM | POA: Diagnosis not present

## 2017-06-07 DIAGNOSIS — Z79899 Other long term (current) drug therapy: Secondary | ICD-10-CM | POA: Insufficient documentation

## 2017-06-07 DIAGNOSIS — R1030 Lower abdominal pain, unspecified: Secondary | ICD-10-CM

## 2017-06-07 LAB — WET PREP, GENITAL
CLUE CELLS WET PREP: NONE SEEN
Sperm: NONE SEEN
Trich, Wet Prep: NONE SEEN
Yeast Wet Prep HPF POC: NONE SEEN

## 2017-06-07 LAB — CBC
HEMATOCRIT: 35.6 % (ref 35.0–47.0)
Hemoglobin: 12.1 g/dL (ref 12.0–16.0)
MCH: 32.1 pg (ref 26.0–34.0)
MCHC: 33.8 g/dL (ref 32.0–36.0)
MCV: 94.9 fL (ref 80.0–100.0)
Platelets: 342 10*3/uL (ref 150–440)
RBC: 3.75 MIL/uL — AB (ref 3.80–5.20)
RDW: 12.9 % (ref 11.5–14.5)
WBC: 5.6 10*3/uL (ref 3.6–11.0)

## 2017-06-07 LAB — URINALYSIS, COMPLETE (UACMP) WITH MICROSCOPIC
BACTERIA UA: NONE SEEN
Bilirubin Urine: NEGATIVE
Glucose, UA: NEGATIVE mg/dL
Hgb urine dipstick: NEGATIVE
KETONES UR: NEGATIVE mg/dL
LEUKOCYTES UA: NEGATIVE
Nitrite: NEGATIVE
PROTEIN: NEGATIVE mg/dL
Specific Gravity, Urine: 1.02 (ref 1.005–1.030)
pH: 7 (ref 5.0–8.0)

## 2017-06-07 LAB — CHLAMYDIA/NGC RT PCR (ARMC ONLY)
Chlamydia Tr: NOT DETECTED
N gonorrhoeae: NOT DETECTED

## 2017-06-07 LAB — BASIC METABOLIC PANEL
Anion gap: 5 (ref 5–15)
BUN: 8 mg/dL (ref 6–20)
CHLORIDE: 106 mmol/L (ref 101–111)
CO2: 26 mmol/L (ref 22–32)
CREATININE: 0.71 mg/dL (ref 0.44–1.00)
Calcium: 9 mg/dL (ref 8.9–10.3)
GFR calc non Af Amer: >60 mL/min (ref >60)
Glucose, Bld: 87 mg/dL (ref 65–99)
Potassium: 3.9 mmol/L (ref 3.5–5.1)
Sodium: 137 mmol/L (ref 135–145)

## 2017-06-07 LAB — HCG, QUANTITATIVE, PREGNANCY: hCG, Beta Chain, Quant, S: 1204 m[IU]/mL — ABNORMAL HIGH (ref <5)

## 2017-06-07 LAB — POCT PREGNANCY, URINE: Preg Test, Ur: POSITIVE — AB

## 2017-06-07 MED ORDER — HYDROCODONE-ACETAMINOPHEN 5-325 MG PO TABS: 1.0000 | ORAL_TABLET | ORAL | 0 refills | Status: DC | PRN

## 2017-06-07 MED ORDER — PENICILLIN V POTASSIUM 500 MG PO TABS: 500.0000 mg | ORAL_TABLET | Freq: Four times a day (QID) | ORAL | 0 refills | Status: DC

## 2017-06-07 NOTE — ED Notes (Signed)
Pt informed of delay in waiting for OB-GYN to return call back to Dr. Darnelle CatalanMalinda, pt understanding of delay and denies any needs at this time.

## 2017-06-07 NOTE — Discharge Instructions (Addendum)
please follow-up with your dentist. In the meantime you can take the penicillin one pill 4 times a day and the hydrocodone if needed, one pill 4 times a day. Be careful hydrocodone can make you sleepy and constipated. The pelvic discomfort would most likely be best treated with Motrin 3 or 4 of the over-the-counter pills 3 times a day. Please follow-up with OB/GYN if you is not well in a week or 2. Please return for worse pain fever vomiting or feeling sicker or if she gets very heavy bleeding.

## 2017-06-07 NOTE — ED Notes (Signed)
Patient returned from Ultrasound. 

## 2017-06-07 NOTE — ED Triage Notes (Addendum)
Pt to ER via POV c/o mild bilateral lower abdominal crapming X 1 week. States she had a medical abortion on 05/23/17 and passed fetus. Pt reporting clear and white discharge from vagina. Pt also c/o left lower dental pain.

## 2017-06-07 NOTE — ED Notes (Signed)
Pt transported to US. Warm blanket given, introduced myself to patient.

## 2017-06-07 NOTE — ED Provider Notes (Signed)
[facility] Emergency Department Provider Note   ____________________________________________   First MD Initiated Contact with Patient 06/07/17 1158     (approximate)  I have reviewed the triage vital signs and the nursing notes.   HISTORY  Chief Complaint Abdominal Cramping and Dental Pain    HPI Stephanie Faulkner is a 32 y.o. female Patient reports she had a filling cracked and fall out her tooth is aching leading into the lower jaw. She also says she had a medical abortion on May 23 and now she is having some mild bilateral lower abdominal cramping going on for about a week slight clearish discharge. No fever no other complaints.   Past Medical History:  Diagnosis Date  . Abortion   . Anxiety   . History of D&C     There are no active problems to display for this patient.   Past Surgical History:  Procedure Laterality Date  . CERVICAL CONE BIOPSY      Prior to Admission medications   Medication Sig Start Date End Date Taking? Authorizing Provider  Doxylamine-Pyridoxine (DICLEGIS) 10-10 MG TBEC 2 tabs orally at hs (Day 1). If this works, continue taking 2 tab qhs. However, if nausea persists Day 2, take 2 tabs at bedtime that night then take three tablets starting on Day 3 (one tablet in the morning and two tablets at bedtime). If these 3 tablets control symptoms on Day 4, continue taking 3 tabs daily. Otherwise take 4 tabs starting on Day 4 (one tablet in the morning, one tablet mid-afternoon and two tablets at bedtime). 05/21/17   [name], James A, MD  HYDROcodone-acetaminophen (NORCO) 5-325 MG tablet Take 1 tablet by mouth every 4 (four) hours as needed for moderate pain. 06/07/17   Arnaldo NatalMalinda, Paul F, MD  metroNIDAZOLE (FLAGYL) 500 MG tablet Take 1 tablet (500 mg total) by mouth 2 (two) times daily. 05/06/17   Tommi RumpsSummers, Rhonda L, PA-C  penicillin v potassium (VEETID) 500 MG tablet Take 1 tablet (500 mg total) by mouth 4 (four) times daily. 06/07/17    Arnaldo NatalMalinda, Paul F, MD  PRENATAL 28-0.8 MG TABS 1 tablet daily. 02/18/17   [name], [name], DO  promethazine (PHENERGAN) 25 MG suppository Place 1 suppository (25 mg total) rectally every 6 (six) hours as needed for nausea. 05/21/17 05/21/18  [name], James A, MD    Allergies Patient has no known allergies.  Family History  Problem Relation Age of Onset  . Diabetes Mother   . Hypertension Mother     Social History Social History   Tobacco Use  . Smoking status: Never Smoker  . Smokeless tobacco: Never Used  Substance Use Topics  . Alcohol use: No  . Drug use: Yes    Types: Marijuana    Comment: occasionally     Review of Systems  Constitutional: No fever/chills Eyes: No visual changes. ENT: No sore throat. Cardiovascular: Denies chest pain. Respiratory: Denies shortness of breath. Gastrointestinal: mild lower abdominal pain.  No nausea, no vomiting.  No diarrhea.  No constipation. Genitourinary: Negative for dysuria. Musculoskeletal: Negative for back pain. Skin: Negative for rash. Neurological: Negative for headaches, focal weakness  ____________________________________________   PHYSICAL EXAM:  VITAL SIGNS: ED Triage Vitals  Enc Vitals Group     BP 06/07/17 1121 140/81     Pulse Rate 06/07/17 1121 71     Resp 06/07/17 1121 18     Temp 06/07/17 1121 98.6 F (37 C)     Temp Source 06/07/17 1121  Oral     SpO2 06/07/17 1121 100 %     Weight 06/07/17 1122 135 lb (61.2 kg)     Height 06/07/17 1122 5\' 7"  (1.702 m)     Head Circumference --      Peak Flow --      Pain Score 06/07/17 1122 4     Pain Loc --      Pain Edu? --      Excl. in GC? --     Constitutional: Alert and oriented. Well appearing and in no acute distress. Eyes: Conjunctivae are normal.  Head: Atraumatic. Nose: No congestion/rhinnorhea. Mouth/Throat: Mucous membranes are moist.  Oropharynx non-erythematous. Neck: No stridor. Cardiovascular: Normal rate, regular rhythm. Grossly normal heart  sounds.  Good peripheral circulation. Respiratory: Normal respiratory effort.  No retractions. Lungs CTAB. Gastrointestinal: Soft minimal very low abdominal tenderness to palpation. No distention. No abdominal bruits. No CVA tenderness. Genitourinary: slight yellowish discharge from the os, vagina otherwise normal there is no cervical motion tenderness minimal adnexal discomfort on palpation Musculoskeletal: No lower extremity tenderness nor edema.   Neurologic:  Normal speech and language. No gross focal neurologic deficits are appreciated. No gait instability. Skin:  Skin is warm, dry and intact. No rash noted. Psychiatric: Mood and affect are normal. Speech and behavior are normal.  ____________________________________________   LABS (all labs ordered are listed, but only abnormal results are displayed)  Labs Reviewed  WET PREP, GENITAL - Abnormal; Notable for the following components:      Result Value   WBC, Wet Prep HPF POC FEW (*)    All other components within normal limits  HCG, QUANTITATIVE, PREGNANCY - Abnormal; Notable for the following components:   hCG, Beta Chain, Quant, S 1,204 (*)    All other components within normal limits  CBC - Abnormal; Notable for the following components:   RBC 3.75 (*)    All other components within normal limits  URINALYSIS, COMPLETE (UACMP) WITH MICROSCOPIC - Abnormal; Notable for the following components:   Color, Urine YELLOW (*)    APPearance CLEAR (*)    All other components within normal limits  POCT PREGNANCY, URINE - Abnormal; Notable for the following components:   Preg Test, Ur POSITIVE (*)    All other components within normal limits  CHLAMYDIA/NGC RT PCR ([facility] ONLY)  BASIC METABOLIC PANEL  POC URINE PREG, ED   ____________________________________________  EKG   ____________________________________________  RADIOLOGY  ED MD interpretation:    Official radiology report(s): US Pelvic Complete With  Transvaginal  Result Date: 06/07/2017 CLINICAL DATA:  Recent abortion with cramping and pelvic pain. Reported positive urine pregnancy test. EXAM: TRANSABDOMINAL AND TRANSVAGINAL ULTRASOUND OF PELVIS TECHNIQUE: Study was performed transabdominally to optimize pelvic field of view evaluation and transvaginally to optimize internal visceral architecture evaluation. COMPARISON:  May 25, 2015 FINDINGS: Uterus Measurements: 7.3 x 5.8 x 6.1 cm. No fibroids or other mass visualized. Endometrium Thickness: 6 mm. There is fluid and ill-defined echogenic material within the endometrium. Right ovary Measurements: 3.6 x 2.0 x 3.1 cm. Normal appearance/no adnexal mass. Left ovary Measurements: 3.7 x 2.3 x 3.1 cm. Normal appearance/no adnexal mass. Multiple follicles are noted in the left ovary, largest measuring 1.3 cm in length. Other findings No abnormal free fluid. IMPRESSION: Endometrium does not appear thickened. However, there is inhomogeneous material within the endometrium as well as mild fluid. Question thrombus secondary to recent abortion. Superimposed retained products of conception cannot be entirely excluded in this circumstance. Study otherwise unremarkable.  Electronically Signed   By: Bretta Bang III M.D.   On: 06/07/2017 12:54    ____________________________________________   PROCEDURES  Procedure(s) performed:  Procedures  Critical Care performed:   ____________________________________________   INITIAL IMPRESSION / ASSESSMENT AND PLAN / ED COURSE   I discussed the patient with Dr. Gaynelle Arabian on call for OB/GYN. The patient has no bleeding minimal tenderness. She feels that the ultrasound really doesn't look like there is much tissue in there and she should be fine being treated with Motrin for any discomfort. I will give the patient follow-up with OB/GYN in a week or 2.        ____________________________________________   FINAL CLINICAL IMPRESSION(S) / ED DIAGNOSES  Final  diagnoses:  Pain due to dental caries  Pelvic pain in female     ED Discharge Orders        Ordered    penicillin v potassium (VEETID) 500 MG tablet  4 times daily     06/07/17 1305    HYDROcodone-acetaminophen (NORCO) 5-325 MG tablet  Every 4 hours PRN     06/07/17 1305       Note:  This document was prepared using Dragon voice recognition software and may include unintentional dictation errors.    [name], MD 06/07/17 [phone]

## 2017-06-07 NOTE — ED Notes (Signed)
Lab notified that hcg that was sent at 11:32 had not been processed as well as the urine sample, spoke with Gwen.

## 2017-06-13 ENCOUNTER — Emergency Department
Admission: EM | Admit: 2017-06-13 | Discharge: 2017-06-13 | Disposition: A | Discharge status: Home / Self Care | Payer: Medicaid Other | Attending: Emergency Medicine | Admitting: Emergency Medicine

## 2017-06-13 ENCOUNTER — Encounter: Payer: Self-pay | Admitting: Intensive Care

## 2017-06-13 ENCOUNTER — Emergency Department: Payer: Medicaid Other

## 2017-06-13 DIAGNOSIS — R1030 Lower abdominal pain, unspecified: Secondary | ICD-10-CM | POA: Diagnosis present

## 2017-06-13 DIAGNOSIS — G8918 Other acute postprocedural pain: Secondary | ICD-10-CM

## 2017-06-13 DIAGNOSIS — F419 Anxiety disorder, unspecified: Secondary | ICD-10-CM | POA: Diagnosis not present

## 2017-06-13 DIAGNOSIS — IMO0002 Reserved for concepts with insufficient information to code with codable children: Secondary | ICD-10-CM

## 2017-06-13 DIAGNOSIS — O034 Incomplete spontaneous abortion without complication: Secondary | ICD-10-CM

## 2017-06-13 DIAGNOSIS — R109 Unspecified abdominal pain: Secondary | ICD-10-CM

## 2017-06-13 LAB — CBC
HCT: 34.1 % — ABNORMAL LOW (ref 35.0–47.0)
HEMOGLOBIN: 11.6 g/dL — AB (ref 12.0–16.0)
MCH: 32.2 pg (ref 26.0–34.0)
MCHC: 34.1 g/dL (ref 32.0–36.0)
MCV: 94.4 fL (ref 80.0–100.0)
PLATELETS: 293 10*3/uL (ref 150–440)
RBC: 3.62 MIL/uL — AB (ref 3.80–5.20)
RDW: 13 % (ref 11.5–14.5)
WBC: 5 10*3/uL (ref 3.6–11.0)

## 2017-06-13 LAB — URINALYSIS, COMPLETE (UACMP) WITH MICROSCOPIC
BILIRUBIN URINE: NEGATIVE
Bacteria, UA: NONE SEEN
GLUCOSE, UA: NEGATIVE mg/dL
HGB URINE DIPSTICK: NEGATIVE
Ketones, ur: NEGATIVE mg/dL
Leukocytes, UA: NEGATIVE
NITRITE: NEGATIVE
PROTEIN: NEGATIVE mg/dL
Specific Gravity, Urine: 1.019 (ref 1.005–1.030)
WBC UA: NONE SEEN WBC/hpf (ref 0–5)
pH: 6 (ref 5.0–8.0)

## 2017-06-13 LAB — COMPREHENSIVE METABOLIC PANEL
ALK PHOS: 47 U/L (ref 38–126)
ALT: 23 U/L (ref 14–54)
ANION GAP: 4 — AB (ref 5–15)
AST: 18 U/L (ref 15–41)
Albumin: 3.5 g/dL (ref 3.5–5.0)
BUN: 9 mg/dL (ref 6–20)
CALCIUM: 8.6 mg/dL — AB (ref 8.9–10.3)
CO2: 22 mmol/L (ref 22–32)
CREATININE: 0.57 mg/dL (ref 0.44–1.00)
Chloride: 112 mmol/L — ABNORMAL HIGH (ref 101–111)
GFR calc Af Amer: >60 mL/min (ref >60)
Glucose, Bld: 99 mg/dL (ref 65–99)
Potassium: 3.5 mmol/L (ref 3.5–5.1)
SODIUM: 138 mmol/L (ref 135–145)
Total Bilirubin: 0.6 mg/dL (ref 0.3–1.2)
Total Protein: 6.5 g/dL (ref 6.5–8.1)

## 2017-06-13 LAB — LIPASE, BLOOD: Lipase: 22 U/L (ref 11–51)

## 2017-06-13 LAB — TYPE AND SCREEN
ABO/RH(D): A POS
ANTIBODY SCREEN: NEGATIVE

## 2017-06-13 LAB — POC URINE PREG, ED: PREG TEST UR: POSITIVE — AB

## 2017-06-13 LAB — HCG, QUANTITATIVE, PREGNANCY: hCG, Beta Chain, Quant, S: 825 m[IU]/mL — ABNORMAL HIGH (ref <5)

## 2017-06-13 MED ORDER — HYDROCODONE-ACETAMINOPHEN 5-325 MG PO TABS: 1.0000 | ORAL_TABLET | Freq: Four times a day (QID) | ORAL | 0 refills | Status: DC | PRN

## 2017-06-13 MED ORDER — NAPROXEN 500 MG PO TABS
500.0000 mg | ORAL_TABLET | Freq: Once | ORAL | Status: AC
  Administered 2017-06-13: 500 mg via ORAL
  Filled 2017-06-13: qty 1

## 2017-06-13 NOTE — ED Triage Notes (Signed)
Patient presents with lower abdominal pain. Was seen here X2-3 days ago for same. Patient states she had a medical abortion 2-3 weeks ago and OB from [facility] saw her here 2-3days ago after US. [place] showed some tissue present and OB reported she did not need any further medical trx. Patient states her pain still has not gone away. A&O x4. No fever. Ambulatory with no problems

## 2017-06-13 NOTE — Discharge Instructions (Signed)
It was a pleasure to take care of you today, and thank you for coming to our emergency department.  If you have any questions or concerns before leaving please ask the nurse to grab me and I'm more than happy to go through your aftercare instructions again.  If you were prescribed any opioid pain medication today such as Norco, Vicodin, Percocet, morphine, hydrocodone, or oxycodone please make sure you do not drive when you are taking this medication as it can alter your ability to drive safely.  If you have any concerns once you are home that you are not improving or are in fact getting worse before you can make it to your follow-up appointment, please do not hesitate to call 911 and come back for further evaluation.  Merrily Brittle, MD  Results for orders placed or performed during the hospital encounter of 06/13/17  Lipase, blood  Result Value Ref Range   Lipase 22 11 - 51 U/L  Comprehensive metabolic panel  Result Value Ref Range   Sodium 138 135 - 145 mmol/L   Potassium 3.5 3.5 - 5.1 mmol/L   Chloride 112 (H) 101 - 111 mmol/L   CO2 22 22 - 32 mmol/L   Glucose, Bld 99 65 - 99 mg/dL   BUN 9 6 - 20 mg/dL   Creatinine, Ser 5.40 0.44 - 1.00 mg/dL   Calcium 8.6 (L) 8.9 - 10.3 mg/dL   Total Protein 6.5 6.5 - 8.1 g/dL   Albumin 3.5 3.5 - 5.0 g/dL   AST 18 15 - 41 U/L   ALT 23 14 - 54 U/L   Alkaline Phosphatase 47 38 - 126 U/L   Total Bilirubin 0.6 0.3 - 1.2 mg/dL   GFR calc non Af Amer >60 >60 mL/min   GFR calc Af Amer >60 >60 mL/min   Anion gap 4 (L) 5 - 15  CBC  Result Value Ref Range   WBC 5.0 3.6 - 11.0 K/uL   RBC 3.62 (L) 3.80 - 5.20 MIL/uL   Hemoglobin 11.6 (L) 12.0 - 16.0 g/dL   HCT 98.1 (L) 19.1 - 47.8 %   MCV 94.4 80.0 - 100.0 fL   MCH 32.2 26.0 - 34.0 pg   MCHC 34.1 32.0 - 36.0 g/dL   RDW 29.5 62.1 - 30.8 %   Platelets 293 150 - 440 K/uL  Urinalysis, Complete w Microscopic  Result Value Ref Range   Color, Urine YELLOW (A) YELLOW   APPearance HAZY (A) CLEAR   Specific Gravity, Urine 1.019 1.005 - 1.030   pH 6.0 5.0 - 8.0   Glucose, UA NEGATIVE NEGATIVE mg/dL   Hgb urine dipstick NEGATIVE NEGATIVE   Bilirubin Urine NEGATIVE NEGATIVE   Ketones, ur NEGATIVE NEGATIVE mg/dL   Protein, ur NEGATIVE NEGATIVE mg/dL   Nitrite NEGATIVE NEGATIVE   Leukocytes, UA NEGATIVE NEGATIVE   RBC / HPF 0-5 0 - 5 RBC/hpf   WBC, UA NONE SEEN 0 - 5 WBC/hpf   Bacteria, UA NONE SEEN NONE SEEN   Squamous Epithelial / LPF 6-10 0 - 5  hCG, quantitative, pregnancy  Result Value Ref Range   hCG, Beta Chain, Quant, S 825 (H) <5 mIU/mL  POC urine preg, ED  Result Value Ref Range   Preg Test, Ur Positive (A) Negative  Type and screen [facility]  Result Value Ref Range   ABO/RH(D) A POS    Antibody Screen NEG    Sample Expiration      06/16/2017 Performed at  [facility], [address] [place], [place] [address]    [place] Pelvic Complete With Transvaginal  Result Date: 06/07/2017 CLINICAL DATA:  Recent abortion with cramping and pelvic pain. Reported positive urine pregnancy test. EXAM: TRANSABDOMINAL AND TRANSVAGINAL ULTRASOUND OF PELVIS TECHNIQUE: Study was performed transabdominally to optimize pelvic field of view evaluation and transvaginally to optimize internal visceral architecture evaluation. COMPARISON:  May 25, 2015 FINDINGS: Uterus Measurements: 7.3 x 5.8 x 6.1 cm. No fibroids or other mass visualized. Endometrium Thickness: 6 mm. There is fluid and ill-defined echogenic material within the endometrium. Right ovary Measurements: 3.6 x 2.0 x 3.1 cm. Normal appearance/no adnexal mass. Left ovary Measurements: 3.7 x 2.3 x 3.1 cm. Normal appearance/no adnexal mass. Multiple follicles are noted in the left ovary, largest measuring 1.3 cm in length. Other findings No abnormal free fluid. IMPRESSION: Endometrium does not appear thickened. However, there is inhomogeneous material within the endometrium as well as mild fluid. Question thrombus  secondary to recent abortion. Superimposed retained products of conception cannot be entirely excluded in this circumstance. Study otherwise unremarkable. Electronically Signed   By: Bretta BangWilliam  Woodruff III M.D.   On: 06/07/2017 12:54   [place] Ob Less Than 14 Weeks With Ob Transvaginal  Result Date: 06/13/2017 CLINICAL DATA:  Pelvic pain and cramping. Recent abortion. Persistent positive beta HCG. EXAM: TRANSABDOMINAL AND TRANSVAGINAL ULTRASOUND OF PELVIS TECHNIQUE: Both transabdominal and transvaginal ultrasound examinations of the pelvis were performed. Transabdominal technique was performed for global imaging of the pelvis including uterus, ovaries, adnexal regions, and pelvic cul-de-sac. It was necessary to proceed with endovaginal exam following the transabdominal exam to visualize the endometrium and ovaries. COMPARISON:  None FINDINGS: Uterus Measurements: 9.6 x 5.7 x 7.3 cm. No fibroids or other mass visualized. Endometrium Thickness: 14 mm. Persistent heterogeneous soft tissue density seen in the endometrial cavity which shows internal blood flow on color Doppler ultrasound. This is consistent with retained products of conception. Right ovary Measurements: 2.5 x 2.2 x 2.9 cm. Normal appearance/no adnexal mass. Left ovary Measurements: 4.9 x 3.3 x 6.7 cm. A simple cyst is seen which measures 4.5 cm. A mildly complex cyst with thin internal septations is seen which also measures 4.5 cm. No mural nodular or other solid component seen. Other findings Small amount of simple free fluid noted. IMPRESSION: Findings consistent with retained products conception, as described above. Simple and mildly complex left ovarian cysts, both measuring 4.5 cm. These are new since previous study, and are likely physiologic. Recommend continued follow-up by ultrasound in 6-12 weeks. Electronically Signed   By: Myles RosenthalJohn  Stahl M.D.   On: 06/13/2017 10:29

## 2017-06-13 NOTE — ED Provider Notes (Signed)
[facility] Emergency Department Provider Note  ____________________________________________   First MD Initiated Contact with Patient 06/13/17 [phone]     (approximate)  I have reviewed the triage vital signs and the nursing notes.   HISTORY  Chief Complaint Abdominal Pain   HPI Kurt Azimi is a 32 y.o. female G5P31 miscarriage 1 therapeutic abortion who comes to the emergency department with 2-1/2 weeks of lower abdominal pain.  She had a therapeutic abortion performed on May 23.  It was performed medically at a clinic in [place].  She is unclear if she is ever passed products of conception and she has had persistent and worsening abdominal pain ever since.  She was seen in our emergency department 6 days ago where she had an ultrasound showing scattered debris and was evaluated by Dr. Jerene Pitch of [facility] gynecology who felt it did not represent retained products and the patient was discharged home.  She comes to the emergency department today not with bleeding but with persistent moderate severity cramping lower abdominal pain.  Nothing particular seems to make her symptoms better or worse.  She denies fevers or chills.  Past Medical History:  Diagnosis Date  . Abortion   . Anxiety   . History of D&C     There are no active problems to display for this patient.   Past Surgical History:  Procedure Laterality Date  . CERVICAL CONE BIOPSY      Prior to Admission medications   Medication Sig Start Date End Date Taking? Authorizing Provider  Doxylamine-Pyridoxine (DICLEGIS) 10-10 MG TBEC 2 tabs orally at hs (Day 1). If this works, continue taking 2 tab qhs. However, if nausea persists Day 2, take 2 tabs at bedtime that night then take three tablets starting on Day 3 (one tablet in the morning and two tablets at bedtime). If these 3 tablets control symptoms on Day 4, continue taking 3 tabs daily. Otherwise take 4 tabs starting on Day 4 (one tablet in the morning,  one tablet mid-afternoon and two tablets at bedtime). 05/21/17  Yes Jeanmarie Plant, MD  penicillin v potassium (VEETID) 500 MG tablet Take 1 tablet (500 mg total) by mouth 4 (four) times daily. 06/07/17  Yes [name], MD  PRENATAL 28-0.8 MG TABS 1 tablet daily. Patient taking differently: Take 1 tablet by mouth daily.  02/18/17  Yes Cook, [name], DO  HYDROcodone-acetaminophen (NORCO) 5-325 MG tablet Take 1 tablet by mouth every 4 (four) hours as needed for moderate pain. Patient not taking: Reported on 06/13/2017 06/07/17   [name], MD  HYDROcodone-acetaminophen (NORCO) 5-325 MG tablet Take 1 tablet by mouth every 6 (six) hours as needed for up to 7 doses for severe pain. 06/13/17   Merrily Brittle, MD  metroNIDAZOLE (FLAGYL) 500 MG tablet Take 1 tablet (500 mg total) by mouth 2 (two) times daily. Patient not taking: Reported on 06/13/2017 05/06/17   Tommi Rumps, PA-C  promethazine (PHENERGAN) 25 MG suppository Place 1 suppository (25 mg total) rectally every 6 (six) hours as needed for nausea. 05/21/17 05/21/18  Jeanmarie Plant, MD    Allergies Patient has no known allergies.  Family History  Problem Relation Age of Onset  . Diabetes Mother   . Hypertension Mother     Social History Social History   Tobacco Use  . Smoking status: Never Smoker  . Smokeless tobacco: Never Used  Substance Use Topics  . Alcohol use: No  . Drug use: Yes  Types: Marijuana    Comment: occasionally     Review of Systems Constitutional: No fever/chills Eyes: No visual changes. ENT: No sore throat. Cardiovascular: Denies chest pain. Respiratory: Denies shortness of breath. Gastrointestinal: Positive for abdominal pain.  No nausea, no vomiting.  No diarrhea.  No constipation. Genitourinary: Negative for dysuria. Musculoskeletal: Negative for back pain. Skin: Negative for rash. Neurological: Negative for headaches, focal weakness or  numbness.   ____________________________________________   PHYSICAL EXAM:  VITAL SIGNS: ED Triage Vitals  Enc Vitals Group     BP      Pulse      Resp      Temp      Temp src      SpO2      Weight      Height      Head Circumference      Peak Flow      Pain Score      Pain Loc      Pain Edu?      Excl. in GC?     Constitutional: Alert and oriented x4 anxious appearing nontoxic no diaphoresis speaks in full clear sentences Eyes: PERRL EOMI. Head: Atraumatic. Nose: No congestion/rhinnorhea. Mouth/Throat: No trismus Neck: No stridor.   Cardiovascular: Normal rate, regular rhythm. Grossly normal heart sounds.  Good peripheral circulation. Respiratory: Normal respiratory effort.  No retractions. Lungs CTAB and moving good air Gastrointestinal: Soft nondistended mild lower abdominal tenderness with no rebound or guarding or peritonitis Musculoskeletal: No lower extremity edema   Neurologic:  Normal speech and language. No gross focal neurologic deficits are appreciated. Skin:  Skin is warm, dry and intact. No rash noted. Psychiatric: Anxious appearing   ____________________________________________   DIFFERENTIAL includes but not limited to  Septic abortion, retained products of conception, appendicitis, diverticulitis ____________________________________________   LABS (all labs ordered are listed, but only abnormal results are displayed)  Labs Reviewed  COMPREHENSIVE METABOLIC PANEL - Abnormal; Notable for the following components:      Result Value   Chloride 112 (*)    Calcium 8.6 (*)    Anion gap 4 (*)    All other components within normal limits  CBC - Abnormal; Notable for the following components:   RBC 3.62 (*)    Hemoglobin 11.6 (*)    HCT 34.1 (*)    All other components within normal limits  URINALYSIS, COMPLETE (UACMP) WITH MICROSCOPIC - Abnormal; Notable for the following components:   Color, Urine YELLOW (*)    APPearance HAZY (*)    All  other components within normal limits  HCG, QUANTITATIVE, PREGNANCY - Abnormal; Notable for the following components:   hCG, Beta Chain, Quant, S 825 (*)    All other components within normal limits  POC URINE PREG, ED - Abnormal; Notable for the following components:   Preg Test, Ur Positive (*)    All other components within normal limits  LIPASE, BLOOD  TYPE AND SCREEN    Lab work reviewed by me with down trending hCG __________________________________________  EKG   ____________________________________________  RADIOLOGY  Pelvic ultrasound reviewed by me consistent with retained products of conception ____________________________________________   PROCEDURES  Procedure(s) performed: no  Procedures  Critical Care performed: no  Observation: no ____________________________________________   INITIAL IMPRESSION / ASSESSMENT AND PLAN / ED COURSE  Pertinent labs & imaging results that were available during my care of the patient were reviewed by me and considered in my medical decision making (see chart for details).       -----------------------------------------  10:40 AM on 06/13/2017 -----------------------------------------  Ultrasound shows retained products of conception.  She does not have an OB gynecologist.  I have a call out to unassigned OB right now to discuss possible D&C. ____________________________________________ ----------------------------------------- 10:56 AM on 06/13/2017 -----------------------------------------  I discussed with on-call OB gynecologist Dr. Feliberto GottronSchermerhorn who indicated as the patient is not septic, is not actively bleeding, and is hemodynamically stable there is no indication for emergent D&C and he has recommended follow-up in the clinic who performed the procedure.  I will reach out to [facility] Dr. Jerene PitchSchuman now to see if she's available for a D and C today   I spoke with Dr. Jerene PitchSchuman who will kindly come  evaluate the patient however as she is operating she will not be available for several hours.  I then disclose this information to the patient who called her on gynecologist and she is able to follow-up in clinic tomorrow morning.  Again I emphasized the patient that we will work happy to care for her today and perform the D&C this evening however she preferred to go home and be managed tomorrow.  She is discharged home in improved condition verbalizes understanding and agreement with the plan.  FINAL CLINICAL IMPRESSION(S) / ED DIAGNOSES  Final diagnoses:  Retained products of conception without hemorrhage      NEW MEDICATIONS STARTED DURING THIS VISIT:  Discharge Medication List as of 06/13/2017 12:44 PM    START taking these medications   Details  !! HYDROcodone-acetaminophen (NORCO) 5-325 MG tablet Take 1 tablet by mouth every 6 (six) hours as needed for up to 7 doses for severe pain., Starting Thu 06/13/2017, Print     !! - Potential duplicate medications found. Please discuss with provider.       Note:  This document was prepared using Dragon voice recognition software and may include unintentional dictation errors.     Merrily Brittleifenbark, Colin Norment, MD 06/15/17 1310

## 2017-06-15 ENCOUNTER — Emergency Department
Admission: EM | Admit: 2017-06-15 | Discharge: 2017-06-15 | Disposition: A | Discharge status: Home / Self Care | Payer: Medicaid Other | Attending: Emergency Medicine | Admitting: Emergency Medicine

## 2017-06-15 ENCOUNTER — Encounter: Payer: Self-pay | Admitting: Emergency Medicine

## 2017-06-15 ENCOUNTER — Other Ambulatory Visit: Payer: Self-pay

## 2017-06-15 DIAGNOSIS — R1110 Vomiting, unspecified: Secondary | ICD-10-CM | POA: Insufficient documentation

## 2017-06-15 DIAGNOSIS — R112 Nausea with vomiting, unspecified: Secondary | ICD-10-CM

## 2017-06-15 DIAGNOSIS — E876 Hypokalemia: Secondary | ICD-10-CM

## 2017-06-15 LAB — COMPREHENSIVE METABOLIC PANEL
ALT: 28 U/L (ref 14–54)
AST: 21 U/L (ref 15–41)
Albumin: 4 g/dL (ref 3.5–5.0)
Alkaline Phosphatase: 63 U/L (ref 38–126)
Anion gap: 9 (ref 5–15)
BUN: 9 mg/dL (ref 6–20)
CHLORIDE: 106 mmol/L (ref 101–111)
CO2: 23 mmol/L (ref 22–32)
CREATININE: 0.73 mg/dL (ref 0.44–1.00)
Calcium: 9.2 mg/dL (ref 8.9–10.3)
GFR calc Af Amer: >60 mL/min (ref >60)
GFR calc non Af Amer: >60 mL/min (ref >60)
Glucose, Bld: 84 mg/dL (ref 65–99)
Potassium: 3.5 mmol/L (ref 3.5–5.1)
SODIUM: 138 mmol/L (ref 135–145)
Total Bilirubin: 0.8 mg/dL (ref 0.3–1.2)
Total Protein: 7.7 g/dL (ref 6.5–8.1)

## 2017-06-15 LAB — CBC
HCT: 37.7 % (ref 35.0–47.0)
Hemoglobin: 13 g/dL (ref 12.0–16.0)
MCH: 32.5 pg (ref 26.0–34.0)
MCHC: 34.5 g/dL (ref 32.0–36.0)
MCV: 94 fL (ref 80.0–100.0)
PLATELETS: 247 10*3/uL (ref 150–440)
RBC: 4.01 MIL/uL (ref 3.80–5.20)
RDW: 12.9 % (ref 11.5–14.5)
WBC: 5.6 10*3/uL (ref 3.6–11.0)

## 2017-06-15 LAB — HCG, QUANTITATIVE, PREGNANCY: hCG, Beta Chain, Quant, S: 284 m[IU]/mL — ABNORMAL HIGH (ref <5)

## 2017-06-15 LAB — LIPASE, BLOOD: LIPASE: 24 U/L (ref 11–51)

## 2017-06-15 MED ORDER — SODIUM CHLORIDE 0.9 % IV SOLN
Freq: Once | INTRAVENOUS | Status: AC
  Administered 2017-06-15: 15:00:00 via INTRAVENOUS

## 2017-06-15 MED ORDER — ONDANSETRON HCL 4 MG/2ML IJ SOLN
4.0000 mg | Freq: Once | INTRAMUSCULAR | Status: AC
  Administered 2017-06-15: 4 mg via INTRAVENOUS
  Filled 2017-06-15: qty 2

## 2017-06-15 MED ORDER — DIPHENHYDRAMINE HCL 50 MG/ML IJ SOLN
25.0000 mg | Freq: Once | INTRAMUSCULAR | Status: AC
  Administered 2017-06-15: 25 mg via INTRAVENOUS
  Filled 2017-06-15: qty 1

## 2017-06-15 MED ORDER — PROMETHAZINE HCL 25 MG PO TABS: 25.0000 mg | ORAL_TABLET | Freq: Four times a day (QID) | ORAL | 0 refills | Status: DC | PRN

## 2017-06-15 NOTE — ED Triage Notes (Signed)
Pt to ED via POV, pt states that she had a D & C done yesterday around 10 am. Pt states that she ate prior to the procedure. Pt states that since around 11 am yesterday she has had N/V. Pt states that she is not able to keep anything down. Pt was given 1 dose of antibiotics to take yesterday and states that she threw those up also. Pt is in NAD at this time. Pt states that she has vomited 6 x in the last 24 hours.

## 2017-06-15 NOTE — ED Triage Notes (Signed)
First Nurse Note:  C/O vomiting since yesterday morning.  States had a D+C done yesterday at 1000, and has had N/V since.

## 2017-06-15 NOTE — ED Provider Notes (Signed)
[facility] Emergency Department Provider Note       Time seen: ----------------------------------------- 3:19 PM on 06/15/2017 -----------------------------------------   I have reviewed the triage vital signs and the nursing notes.  HISTORY   Chief Complaint Emesis    HPI Stephanie Faulkner is a 32 y.o. female with a history of recent abortion, anxiety who presents to the ED for intractable vomiting.  Patient states she had a D&C done yesterday around 10 AM.  Patient states that she ate prior to the procedure, since around 11 AM yesterday she has had intractable nausea and vomiting and has not kept anything down.  She had 1 dose of amoxicillin prior to discharge yesterday which she states she also threw up.  She vomited around 6 times last 24 hours and has had frequent salivation as well.  She denies fevers, chills or other complaints.  She has some residual abdominal pain.  Past Medical History:  Diagnosis Date  . Abortion   . Anxiety   . History of D&C     There are no active problems to display for this patient.   Past Surgical History:  Procedure Laterality Date  . CERVICAL CONE BIOPSY      Allergies Patient has no known allergies.  Social History Social History   Tobacco Use  . Smoking status: Never Smoker  . Smokeless tobacco: Never Used  Substance Use Topics  . Alcohol use: No  . Drug use: Yes    Types: Marijuana    Comment: occasionally    Review of Systems Constitutional: Negative for fever. ENT: Positive for excessive salivation Cardiovascular: Negative for chest pain. Respiratory: Negative for shortness of breath. Gastrointestinal: Negative for acute abdominal pain, positive for vomiting Genitourinary: Negative for dysuria. Musculoskeletal: Negative for back pain. Skin: Negative for rash. Neurological: Negative for headaches, focal weakness or numbness.  All systems negative/normal/unremarkable except as stated in the  HPI  ____________________________________________   PHYSICAL EXAM:  VITAL SIGNS: ED Triage Vitals [06/15/17 1429]  Enc Vitals Group     BP 130/84     Pulse Rate (!) 102     Resp 16     Temp 99.2 F (37.3 C)     Temp Source Oral     SpO2 98 %     Weight 135 lb (61.2 kg)     Height      Head Circumference      Peak Flow      Pain Score 0     Pain Loc      Pain Edu?      Excl. in GC?    Constitutional: Alert and oriented. Well appearing and in no distress. Eyes: Conjunctivae are normal. Normal extraocular movements. ENT   Head: Normocephalic and atraumatic.   Nose: No congestion/rhinnorhea.   Mouth/Throat: Mucous membranes are moist.   Neck: No stridor. Cardiovascular: Normal rate, regular rhythm. No murmurs, rubs, or gallops. Respiratory: Normal respiratory effort without tachypnea nor retractions. Breath sounds are clear and equal bilaterally. No wheezes/rales/rhonchi. Gastrointestinal: Soft and nontender. Normal bowel sounds Musculoskeletal: Nontender with normal range of motion in extremities. No lower extremity tenderness nor edema. Neurologic:  Normal speech and language. No gross focal neurologic deficits are appreciated.  Skin:  Skin is warm, dry and intact. No rash noted. Psychiatric: Mood and affect are normal. Speech and behavior are normal.  ____________________________________________  ED COURSE:  As part of my medical decision making, I reviewed the following data within the electronic MEDICAL RECORD NUMBER History  obtained from family if available, nursing notes, old chart and ekg, as well as notes from prior ED visits. Patient presented for intractable vomiting, we will assess with labs and possibly imaging as indicated at this time. Clinical Course as of Jun 16 1642  Sat Jun 15, 2017  1621 Patient reports feeling much better, has no further nausea or salivation   [JW]    Clinical Course User Index [JW] Emily Filbert, MD    Procedures ____________________________________________   LABS (pertinent positives/negatives)  Labs Reviewed  HCG, QUANTITATIVE, PREGNANCY - Abnormal; Notable for the following components:      Result Value   hCG, Beta Chain, Quant, S 284 (*)    All other components within normal limits  LIPASE, BLOOD  COMPREHENSIVE METABOLIC PANEL  CBC  URINALYSIS, COMPLETE (UACMP) WITH MICROSCOPIC   ____________________________________________  DIFFERENTIAL DIAGNOSIS   Dehydration, electrolyte abnormality, retained products of conception  FINAL ASSESSMENT AND PLAN  Intractable vomiting   Plan: The patient had presented for persistent vomiting. Patient's labs do not reveal any acute process, hCG is decreasing as expected.  She did not require any further imaging during her stay.  Currently she is feeling better.  I will discharge her with Phenergan to take as needed.   Ulice Dash, MD   Note: This note was generated in part or whole with voice recognition software. Voice recognition is usually quite accurate but there are transcription errors that can and very often do occur. I apologize for any typographical errors that were not detected and corrected.     Emily Filbert, MD 06/15/17 [phone]

## 2017-06-15 NOTE — ED Notes (Signed)
Pt reports having a good little nap, that she wouldn't have gotten that at home with her children, pt given apple juice prior to discharge upon request, no distress noted, pt states gratefulness for her care.

## 2017-07-10 ENCOUNTER — Encounter: Payer: Self-pay | Admitting: Emergency Medicine

## 2017-07-10 ENCOUNTER — Other Ambulatory Visit: Payer: Self-pay

## 2017-07-10 DIAGNOSIS — N83292 Other ovarian cyst, left side: Secondary | ICD-10-CM | POA: Diagnosis not present

## 2017-07-10 DIAGNOSIS — R109 Unspecified abdominal pain: Secondary | ICD-10-CM | POA: Diagnosis present

## 2017-07-10 DIAGNOSIS — R102 Pelvic and perineal pain: Secondary | ICD-10-CM | POA: Insufficient documentation

## 2017-07-10 DIAGNOSIS — N83291 Other ovarian cyst, right side: Secondary | ICD-10-CM | POA: Insufficient documentation

## 2017-07-10 DIAGNOSIS — Z79899 Other long term (current) drug therapy: Secondary | ICD-10-CM | POA: Insufficient documentation

## 2017-07-10 LAB — CBC WITH DIFFERENTIAL/PLATELET
BASOS ABS: 0.1 10*3/uL (ref 0–0.1)
Basophils Relative: 1 %
EOS PCT: 12 %
Eosinophils Absolute: 0.8 10*3/uL — ABNORMAL HIGH (ref 0–0.7)
HCT: 36.1 % (ref 35.0–47.0)
Hemoglobin: 12.6 g/dL (ref 12.0–16.0)
LYMPHS ABS: 2.9 10*3/uL (ref 1.0–3.6)
LYMPHS PCT: 45 %
MCH: 32.9 pg (ref 26.0–34.0)
MCHC: 35 g/dL (ref 32.0–36.0)
MCV: 94 fL (ref 80.0–100.0)
MONO ABS: 0.4 10*3/uL (ref 0.2–0.9)
Monocytes Relative: 6 %
NEUTROS ABS: 2.4 10*3/uL (ref 1.4–6.5)
Neutrophils Relative %: 36 %
Platelets: 319 10*3/uL (ref 150–440)
RBC: 3.84 MIL/uL (ref 3.80–5.20)
RDW: 13.1 % (ref 11.5–14.5)
WBC: 6.5 10*3/uL (ref 3.6–11.0)

## 2017-07-10 LAB — URINALYSIS, COMPLETE (UACMP) WITH MICROSCOPIC
Bilirubin Urine: NEGATIVE
Glucose, UA: NEGATIVE mg/dL
Hgb urine dipstick: NEGATIVE
KETONES UR: NEGATIVE mg/dL
Nitrite: NEGATIVE
PH: 8 (ref 5.0–8.0)
Protein, ur: NEGATIVE mg/dL
Specific Gravity, Urine: 1.021 (ref 1.005–1.030)

## 2017-07-10 LAB — POCT PREGNANCY, URINE: Preg Test, Ur: NEGATIVE

## 2017-07-10 NOTE — ED Triage Notes (Addendum)
Patient ambulatory to triage with steady gait, without difficulty or distress noted; pt reports right lower abd pain radiating around into back accomp by urinary frequency; st seen 3wks ago for same and was told pain was due to the recent D&C; 5/23 had miscarriage, 6/14 D&C performed; st her HCG levels had remained elevated and is requesting HCG to be performed

## 2017-07-11 ENCOUNTER — Encounter: Payer: Self-pay | Admitting: Obstetrics & Gynecology

## 2017-07-11 ENCOUNTER — Emergency Department: Payer: Medicaid Other

## 2017-07-11 ENCOUNTER — Emergency Department
Admission: EM | Admit: 2017-07-11 | Discharge: 2017-07-11 | Disposition: A | Discharge status: Home / Self Care | Payer: Medicaid Other | Attending: Emergency Medicine | Admitting: Emergency Medicine

## 2017-07-11 ENCOUNTER — Ambulatory Visit (INDEPENDENT_AMBULATORY_CARE_PROVIDER_SITE_OTHER): Payer: Medicaid Other | Admitting: Obstetrics & Gynecology

## 2017-07-11 VITALS — BP 120/80 | Ht 67.0 in | Wt 142.0 lb

## 2017-07-11 DIAGNOSIS — R52 Pain, unspecified: Secondary | ICD-10-CM

## 2017-07-11 DIAGNOSIS — N83202 Unspecified ovarian cyst, left side: Secondary | ICD-10-CM

## 2017-07-11 DIAGNOSIS — R102 Pelvic and perineal pain: Secondary | ICD-10-CM

## 2017-07-11 DIAGNOSIS — N83201 Unspecified ovarian cyst, right side: Secondary | ICD-10-CM

## 2017-07-11 DIAGNOSIS — N914 Secondary oligomenorrhea: Secondary | ICD-10-CM

## 2017-07-11 LAB — COMPREHENSIVE METABOLIC PANEL
ALBUMIN: 4 g/dL (ref 3.5–5.0)
ALT: 30 U/L (ref 0–44)
ANION GAP: 6 (ref 5–15)
AST: 25 U/L (ref 15–41)
Alkaline Phosphatase: 60 U/L (ref 38–126)
BUN: 10 mg/dL (ref 6–20)
CO2: 27 mmol/L (ref 22–32)
Calcium: 9.4 mg/dL (ref 8.9–10.3)
Chloride: 107 mmol/L (ref 98–111)
Creatinine, Ser: 0.88 mg/dL (ref 0.44–1.00)
GFR calc Af Amer: >60 mL/min (ref >60)
GFR calc non Af Amer: >60 mL/min (ref >60)
GLUCOSE: 96 mg/dL (ref 70–99)
POTASSIUM: 3.5 mmol/L (ref 3.5–5.1)
Sodium: 140 mmol/L (ref 135–145)
TOTAL PROTEIN: 7.6 g/dL (ref 6.5–8.1)
Total Bilirubin: 0.5 mg/dL (ref 0.3–1.2)

## 2017-07-11 LAB — CHLAMYDIA/NGC RT PCR (ARMC ONLY)
CHLAMYDIA TR: NOT DETECTED
N GONORRHOEAE: NOT DETECTED

## 2017-07-11 LAB — WET PREP, GENITAL
CLUE CELLS WET PREP: NONE SEEN
Sperm: NONE SEEN
TRICH WET PREP: NONE SEEN
Yeast Wet Prep HPF POC: NONE SEEN

## 2017-07-11 LAB — HCG, QUANTITATIVE, PREGNANCY: hCG, Beta Chain, Quant, S: 2 m[IU]/mL (ref <5)

## 2017-07-11 MED ORDER — HYDROCODONE-ACETAMINOPHEN 5-325 MG PO TABS: 1.0000 | ORAL_TABLET | Freq: Four times a day (QID) | ORAL | 0 refills | Status: DC | PRN

## 2017-07-11 MED ORDER — IBUPROFEN 600 MG PO TABS: 600.0000 mg | ORAL_TABLET | Freq: Three times a day (TID) | ORAL | 0 refills | Status: DC | PRN

## 2017-07-11 MED ORDER — FOSFOMYCIN TROMETHAMINE 3 G PO PACK
3.0000 g | PACK | Freq: Once | ORAL | Status: AC
  Administered 2017-07-11: 3 g via ORAL
  Filled 2017-07-11: qty 3

## 2017-07-11 MED ORDER — NORETHIN ACE-ETH ESTRAD-FE 1-20 MG-MCG(24) PO CAPS: 1.0000 | ORAL_CAPSULE | Freq: Every day | ORAL | 8 refills | Status: DC

## 2017-07-11 MED ORDER — MEDROXYPROGESTERONE ACETATE 10 MG PO TABS: 10.0000 mg | ORAL_TABLET | Freq: Every day | ORAL | 0 refills | Status: DC

## 2017-07-11 NOTE — ED Notes (Signed)
Patient transported to Ultrasound 

## 2017-07-11 NOTE — ED Notes (Signed)

## 2017-07-11 NOTE — Progress Notes (Signed)
  Abdominal Pain Patient presents for evaluation of abdominal pain. The pain is described as pressure-like, and is 5/10 in intensity. Pain is located in the RLQ and deep pelvis area without radiation. Onset was gradual occurring a few days ago. Symptoms have been unchanged since. Aggravating factors: none. Alleviating factors: none. Associated symptoms: none. The patient denies fever and nausea. Risk factors for pelvic/abdominal pain include medical abortion May 23, and D&C Jun 14; has had ovarian cysts in past; was seen in ER last night w Beta 2 and US neg for cysts and possibly a thickened endometrium.  PMHx: She  has a past medical history of Abortion, Anxiety, and History of D&C. Also,  has a past surgical history that includes Cervical cone biopsy., family history includes Diabetes in her mother; Hypertension in her mother.,  reports that she has never smoked. She has never used smokeless tobacco. She reports that she has current or past drug history. Drug: Marijuana. She reports that she does not drink alcohol.  She has a current medication list which includes the following prescription(s): doxylamine-pyridoxine, hydrocodone-acetaminophen, ibuprofen, medroxyprogesterone, metronidazole, penicillin v potassium, prenatal, promethazine, and promethazine. Also, has No Known Allergies.  Review of Systems  Constitutional: Negative for chills, fever and malaise/fatigue.  HENT: Negative for congestion, sinus pain and sore throat.   Eyes: Negative for blurred vision and pain.  Respiratory: Negative for cough and wheezing.   Cardiovascular: Negative for chest pain and leg swelling.  Gastrointestinal: Negative for abdominal pain, constipation, diarrhea, heartburn, nausea and vomiting.  Genitourinary: Negative for dysuria, frequency, hematuria and urgency.  Musculoskeletal: Negative for back pain, joint pain, myalgias and neck pain.  Skin: Negative for itching and rash.  Neurological: Positive for  headaches. Negative for dizziness, tremors and weakness.  Endo/Heme/Allergies: Does not bruise/bleed easily.  Psychiatric/Behavioral: Negative for depression. The patient is not nervous/anxious and does not have insomnia.    Objective: BP 120/80   Ht 5\' 7"  (1.702 m)   Wt 142 lb (64.4 kg)   BMI 22.24 kg/m  Physical Exam  Constitutional: She is oriented to person, place, and time. She appears well-developed and well-nourished. No distress.  Musculoskeletal: Normal range of motion.  Neurological: She is alert and oriented to person, place, and time.  Skin: Skin is warm and dry.  Psychiatric: She has a normal mood and affect.  Vitals reviewed.  ASSESSMENT/PLAN:   Problem List Items Addressed This Visit      Other   Pelvic pain - Primary   Secondary oligomenorrhea    Provera to induce withdrawal bleed Pt desires OCP for contraception.  To start after withdrawal bleed F/U discussed  Annamarie MajorPaul Mieka Leaton, MD, Merlinda FrederickFACOG [facility], [facility] 07/11/2017  10:29 AM

## 2017-07-11 NOTE — Patient Instructions (Signed)
PROVERA for 10 days now. Then wait for period. Then start Birth Control Pill.     Sample- [place].

## 2017-07-11 NOTE — Discharge Instructions (Addendum)
You may take pain medicines as needed (Motrin/Norco #15). Return to the ER for worsening symptoms, persistent vomiting, heavy vaginal bleeding, or other concerns.

## 2017-07-11 NOTE — ED Provider Notes (Signed)
[facility] Emergency Department Provider Note   ____________________________________________   First MD Initiated Contact with Patient 07/11/17 0147     (approximate)  I have reviewed the triage vital signs and the nursing notes.   HISTORY  Chief Complaint Abdominal Pain    HPI Stephanie Faulkner is a 32 y.o. female who presents to the ED from home with a chief complaint of pelvic pain.  Patient had a miscarriage on 5/23.  Subsequently had a D&C performed on 6/14.  She was seen in the ED 3 weeks ago for similar pelvic pain and was told her pain was due her to a recent D&C.  States this pain feels different and feels like pain she has been having since May.  Denies vaginal bleeding.  Has noted some vaginal discharge.  Last sexual intercourse 1 week ago.  No concern for STD exposure.  Denies associated fever, chills, chest pain, shortness of breath, domino pain, nausea, vomiting, dysuria, diarrhea.  Denies recent travel or trauma.   Past Medical History:  Diagnosis Date  . Abortion   . Anxiety   . History of D&C     There are no active problems to display for this patient.   Past Surgical History:  Procedure Laterality Date  . CERVICAL CONE BIOPSY      Prior to Admission medications   Medication Sig Start Date End Date Taking? Authorizing Provider  Doxylamine-Pyridoxine (DICLEGIS) 10-10 MG TBEC 2 tabs orally at hs (Day 1). If this works, continue taking 2 tab qhs. However, if nausea persists Day 2, take 2 tabs at bedtime that night then take three tablets starting on Day 3 (one tablet in the morning and two tablets at bedtime). If these 3 tablets control symptoms on Day 4, continue taking 3 tabs daily. Otherwise take 4 tabs starting on Day 4 (one tablet in the morning, one tablet mid-afternoon and two tablets at bedtime). 05/21/17   Jeanmarie Plant, MD  HYDROcodone-acetaminophen (NORCO) 5-325 MG tablet Take 1 tablet by mouth every 4 (four) hours as needed  for moderate pain. Patient not taking: Reported on 06/13/2017 06/07/17   [name], MD  HYDROcodone-acetaminophen (NORCO) 5-325 MG tablet Take 1 tablet by mouth every 6 (six) hours as needed for up to 7 doses for severe pain. 06/13/17   Merrily Brittle, MD  metroNIDAZOLE (FLAGYL) 500 MG tablet Take 1 tablet (500 mg total) by mouth 2 (two) times daily. Patient not taking: Reported on 06/13/2017 05/06/17   Bridget Hartshorn L, PA-C  penicillin v potassium (VEETID) 500 MG tablet Take 1 tablet (500 mg total) by mouth 4 (four) times daily. 06/07/17   [name], MD  PRENATAL 28-0.8 MG TABS 1 tablet daily. Patient taking differently: Take 1 tablet by mouth daily.  02/18/17   Tommie Sams, DO  promethazine (PHENERGAN) 25 MG suppository Place 1 suppository (25 mg total) rectally every 6 (six) hours as needed for nausea. 05/21/17 05/21/18  Jeanmarie Plant, MD  promethazine (PHENERGAN) 25 MG tablet Take 1 tablet (25 mg total) by mouth every 6 (six) hours as needed for nausea or vomiting. 06/15/17   Emily Filbert, MD    Allergies Patient has no known allergies.  Family History  Problem Relation Age of Onset  . Diabetes Mother   . Hypertension Mother     Social History Social History   Tobacco Use  . Smoking status: Never Smoker  . Smokeless tobacco: Never Used  Substance Use Topics  .  Alcohol use: No  . Drug use: Yes    Types: Marijuana    Comment: occasionally     Review of Systems  Constitutional: No fever/chills Eyes: No visual changes. ENT: No sore throat. Cardiovascular: Denies chest pain. Respiratory: Denies shortness of breath. Gastrointestinal: Positive for pelvic pain.  No abdominal pain.  No nausea, no vomiting.  No diarrhea.  No constipation. Genitourinary: Positive for vaginal discharge.  Negative for dysuria. Musculoskeletal: Negative for back pain. Skin: Negative for rash. Neurological: Negative for headaches, focal weakness or  numbness.   ____________________________________________   PHYSICAL EXAM:  VITAL SIGNS: ED Triage Vitals  Enc Vitals Group     BP 07/10/17 2327 (!) 145/85     Pulse Rate 07/10/17 2327 74     Resp 07/10/17 2327 20     Temp --      Temp Source 07/10/17 2327 Oral     SpO2 07/10/17 2327 100 %     Weight 07/10/17 2328 142 lb (64.4 kg)     Height 07/10/17 2328 5\' 7"  (1.702 m)     Head Circumference --      Peak Flow --      Pain Score 07/10/17 2327 6     Pain Loc --      Pain Edu? --      Excl. in GC? --     Constitutional: Alert and oriented. Well appearing and in no acute distress. Eyes: Conjunctivae are normal. PERRL. EOMI. Head: Atraumatic. Nose: No congestion/rhinnorhea. Mouth/Throat: Mucous membranes are moist.  Oropharynx non-erythematous. Neck: No stridor.   Cardiovascular: Normal rate, regular rhythm. Grossly normal heart sounds.  Good peripheral circulation. Respiratory: Normal respiratory effort.  No retractions. Lungs CTAB. Gastrointestinal: Soft and nontender to light or deep palpation. No distention. No abdominal bruits. No CVA tenderness. Musculoskeletal: No lower extremity tenderness nor edema.  No joint effusions. Neurologic:  Normal speech and language. No gross focal neurologic deficits are appreciated. No gait instability. Skin:  Skin is warm, dry and intact. No rash noted. Psychiatric: Mood and affect are normal. Speech and behavior are normal.  ____________________________________________   LABS (all labs ordered are listed, but only abnormal results are displayed)  Labs Reviewed  WET PREP, GENITAL - Abnormal; Notable for the following components:      Result Value   WBC, Wet Prep HPF POC FEW (*)    All other components within normal limits  CBC WITH DIFFERENTIAL/PLATELET - Abnormal; Notable for the following components:   Eosinophils Absolute 0.8 (*)    All other components within normal limits  URINALYSIS, COMPLETE (UACMP) WITH MICROSCOPIC -  Abnormal; Notable for the following components:   Color, Urine YELLOW (*)    APPearance CLEAR (*)    Leukocytes, UA TRACE (*)    Bacteria, UA RARE (*)    All other components within normal limits  URINE CULTURE  CHLAMYDIA/NGC RT PCR ([facility] ONLY)  COMPREHENSIVE METABOLIC PANEL  HCG, QUANTITATIVE, PREGNANCY  POCT PREGNANCY, URINE   ____________________________________________  EKG  None ____________________________________________  RADIOLOGY  ED MD interpretation: Ultrasound demonstrates bilateral ovarian cysts recommend trending beta hCG for suspicion of POC  Official radiology report(s): US Pelvis Transvanginal Non-ob (tv Only)  Result Date: 07/11/2017 CLINICAL DATA:  Recent D and C, pelvic pain, evaluate for retained products of conception. Acute lower abdominal pain for 3 days after abortion 05/23/2017. EXAM: TRANSABDOMINAL AND TRANSVAGINAL ULTRASOUND OF PELVIS DOPPLER ULTRASOUND OF OVARIES TECHNIQUE: Both transabdominal and transvaginal ultrasound examinations of the pelvis were performed. Transabdominal  technique was performed for global imaging of the pelvis including uterus, ovaries, adnexal regions, and pelvic cul-de-sac. It was necessary to proceed with endovaginal exam following the transabdominal exam to visualize the uterus and endometrium. Color and duplex Doppler ultrasound was utilized to evaluate blood flow to the ovaries. COMPARISON:  Pelvic ultrasound 06/13/2017 demonstrating obtained products of conception FINDINGS: Uterus Measurements: 9.0 x 5.0 x 6.5 cm. No fibroids or other mass visualized. Endometrium Thickness: 6 mm. There is heterogeneous hypoechoic contents within the endometrial canal. Possible endometrial vascularity superiorly. There is increased vascularity at the junctional zone posteriorly. Right ovary Measurements: 4.0 x 2.7 x 2.5 cm. Complex cyst in the right ovary measures 2.3 cm with peripheral vascularity, possible corpus luteum. Normal blood  flow. Left ovary Measurements: 3.8 x 2.7 x 2.5 cm. Previous complex cyst in the left ovary is no longer seen, there is a simple cyst measuring 2.1 cm. Normal blood flow. Pulsed Doppler evaluation of both ovaries demonstrates normal low-resistance arterial and venous waveforms. Other findings Small amount of free fluid in the pelvis. IMPRESSION: 1. No abnormal endometrial thickening, however there is heterogeneous hypoechoic contents in the endometrial canal with possible endometrial vascularity and increased vascularity at the junctional zone. Findings are suspicious for retained products of conception. Recommend trending of beta HCG. Consider short-term follow-up pelvic ultrasound or further evaluation with pelvic MRI with and without IV contrast as clinically warranted. 2. Normal blood flow to both ovaries without torsion. Possible corpus luteal cyst on the right. Prior complex cyst in the left ovary has resolved. Electronically Signed   By: Rubye Oaks M.D.   On: 07/11/2017 04:03   US Pelvis Complete  Result Date: 07/11/2017 CLINICAL DATA:  Recent D and C, pelvic pain, evaluate for retained products of conception. Acute lower abdominal pain for 3 days after abortion 05/23/2017. EXAM: TRANSABDOMINAL AND TRANSVAGINAL ULTRASOUND OF PELVIS DOPPLER ULTRASOUND OF OVARIES TECHNIQUE: Both transabdominal and transvaginal ultrasound examinations of the pelvis were performed. Transabdominal technique was performed for global imaging of the pelvis including uterus, ovaries, adnexal regions, and pelvic cul-de-sac. It was necessary to proceed with endovaginal exam following the transabdominal exam to visualize the uterus and endometrium. Color and duplex Doppler ultrasound was utilized to evaluate blood flow to the ovaries. COMPARISON:  Pelvic ultrasound 06/13/2017 demonstrating obtained products of conception FINDINGS: Uterus Measurements: 9.0 x 5.0 x 6.5 cm. No fibroids or other mass visualized. Endometrium  Thickness: 6 mm. There is heterogeneous hypoechoic contents within the endometrial canal. Possible endometrial vascularity superiorly. There is increased vascularity at the junctional zone posteriorly. Right ovary Measurements: 4.0 x 2.7 x 2.5 cm. Complex cyst in the right ovary measures 2.3 cm with peripheral vascularity, possible corpus luteum. Normal blood flow. Left ovary Measurements: 3.8 x 2.7 x 2.5 cm. Previous complex cyst in the left ovary is no longer seen, there is a simple cyst measuring 2.1 cm. Normal blood flow. Pulsed Doppler evaluation of both ovaries demonstrates normal low-resistance arterial and venous waveforms. Other findings Small amount of free fluid in the pelvis. IMPRESSION: 1. No abnormal endometrial thickening, however there is heterogeneous hypoechoic contents in the endometrial canal with possible endometrial vascularity and increased vascularity at the junctional zone. Findings are suspicious for retained products of conception. Recommend trending of beta HCG. Consider short-term follow-up pelvic ultrasound or further evaluation with pelvic MRI with and without IV contrast as clinically warranted. 2. Normal blood flow to both ovaries without torsion. Possible corpus luteal cyst on the right. Prior complex cyst in the  left ovary has resolved. Electronically Signed   By: Rubye OaksMelanie  Ehinger M.D.   On: 07/11/2017 04:03   [place] Pelvic Doppler (torsion R/o Or Mass Arterial Flow)  Result Date: 07/11/2017 CLINICAL DATA:  Recent D and C, pelvic pain, evaluate for retained products of conception. Acute lower abdominal pain for 3 days after abortion 05/23/2017. EXAM: TRANSABDOMINAL AND TRANSVAGINAL ULTRASOUND OF PELVIS DOPPLER ULTRASOUND OF OVARIES TECHNIQUE: Both transabdominal and transvaginal ultrasound examinations of the pelvis were performed. Transabdominal technique was performed for global imaging of the pelvis including uterus, ovaries, adnexal regions, and pelvic cul-de-sac. It was  necessary to proceed with endovaginal exam following the transabdominal exam to visualize the uterus and endometrium. Color and duplex Doppler ultrasound was utilized to evaluate blood flow to the ovaries. COMPARISON:  Pelvic ultrasound 06/13/2017 demonstrating obtained products of conception FINDINGS: Uterus Measurements: 9.0 x 5.0 x 6.5 cm. No fibroids or other mass visualized. Endometrium Thickness: 6 mm. There is heterogeneous hypoechoic contents within the endometrial canal. Possible endometrial vascularity superiorly. There is increased vascularity at the junctional zone posteriorly. Right ovary Measurements: 4.0 x 2.7 x 2.5 cm. Complex cyst in the right ovary measures 2.3 cm with peripheral vascularity, possible corpus luteum. Normal blood flow. Left ovary Measurements: 3.8 x 2.7 x 2.5 cm. Previous complex cyst in the left ovary is no longer seen, there is a simple cyst measuring 2.1 cm. Normal blood flow. Pulsed Doppler evaluation of both ovaries demonstrates normal low-resistance arterial and venous waveforms. Other findings Small amount of free fluid in the pelvis. IMPRESSION: 1. No abnormal endometrial thickening, however there is heterogeneous hypoechoic contents in the endometrial canal with possible endometrial vascularity and increased vascularity at the junctional zone. Findings are suspicious for retained products of conception. Recommend trending of beta HCG. Consider short-term follow-up pelvic ultrasound or further evaluation with pelvic MRI with and without IV contrast as clinically warranted. 2. Normal blood flow to both ovaries without torsion. Possible corpus luteal cyst on the right. Prior complex cyst in the left ovary has resolved. Electronically Signed   By: Rubye OaksMelanie  Ehinger M.D.   On: 07/11/2017 04:03    ____________________________________________   PROCEDURES  Procedure(s) performed:   Pelvic exam: External exam within normal limits without rashes, lesions or vesicles.   Speculum exam reveals no vaginal bleeding.  Mild white discharge which is not odorous.  Bimanual exam within normal limits..  Procedures  Critical Care performed: No  ____________________________________________   INITIAL IMPRESSION / ASSESSMENT AND PLAN / ED COURSE  As part of my medical decision making, I reviewed the following data within the electronic MEDICAL RECORD NUMBER Nursing notes reviewed and incorporated, Labs reviewed, Old chart reviewed, Radiograph reviewed  and Notes from prior ED visits   32 year old female who presents with pelvic pain since May; recent D&C 1 month ago with decreasing beta hCG.  Will obtain pelvic ultrasound to evaluate for retained POC/etiology of patient's pain.  Fosfomycin given for trace leukocytes and urinalysis.  Patient declines analgesia.   Clinical Course as of Jul 11 436  Thu Jul 11, 2017  0340 Patient back from ultrasound.  Able to perform pelvic exam.  Awaiting results of ultrasound and wet prep.   [JS]  [id number] Patient asleep.  Updated her of ultrasound results demonstrating bilateral ovarian cysts and recommendations for trending hCG, which has decreased from 284 to 2.  Will follow-up closely with GYN to trend hCG as well as repeat ultrasound to evaluate area suspicious for retained POC. Given a negative HGC, no  vaginal bleeding, and pain since May, I have low suspicious for retained POC requiring surgical intervention.  Strict return precautions given.  Patient verbalizes understanding agrees with plan of care.   [JS]    Clinical Course User Index [JS] Irean Hong, MD     ____________________________________________   FINAL CLINICAL IMPRESSION(S) / ED DIAGNOSES  Final diagnoses:  Pain  Cysts of both ovaries  Pelvic pain in female     ED Discharge Orders    None       Note:  This document was prepared using Dragon voice recognition software and may include unintentional dictation errors.    Irean Hong, MD 07/11/17  0630

## 2017-07-12 LAB — URINE CULTURE: CULTURE: NO GROWTH

## 2017-08-20 ENCOUNTER — Encounter: Payer: Self-pay | Admitting: Emergency Medicine

## 2017-08-20 ENCOUNTER — Emergency Department
Admission: EM | Admit: 2017-08-20 | Discharge: 2017-08-20 | Disposition: A | Discharge status: Home / Self Care | Payer: Medicaid Other | Attending: Student in an Organized Health Care Education/Training Program | Admitting: Student in an Organized Health Care Education/Training Program

## 2017-08-20 ENCOUNTER — Emergency Department: Payer: Medicaid Other

## 2017-08-20 DIAGNOSIS — R202 Paresthesia of skin: Secondary | ICD-10-CM | POA: Diagnosis present

## 2017-08-20 DIAGNOSIS — I10 Essential (primary) hypertension: Secondary | ICD-10-CM | POA: Insufficient documentation

## 2017-08-20 DIAGNOSIS — Z79899 Other long term (current) drug therapy: Secondary | ICD-10-CM | POA: Diagnosis not present

## 2017-08-20 DIAGNOSIS — F430 Acute stress reaction: Secondary | ICD-10-CM | POA: Diagnosis not present

## 2017-08-20 DIAGNOSIS — E119 Type 2 diabetes mellitus without complications: Secondary | ICD-10-CM | POA: Diagnosis not present

## 2017-08-20 LAB — TROPONIN I: Troponin I: <0.03 ng/mL (ref <0.03)

## 2017-08-20 LAB — BASIC METABOLIC PANEL
Anion gap: 9 (ref 5–15)
BUN: 7 mg/dL (ref 6–20)
CALCIUM: 9.3 mg/dL (ref 8.9–10.3)
CHLORIDE: 108 mmol/L (ref 98–111)
CO2: 23 mmol/L (ref 22–32)
CREATININE: 0.84 mg/dL (ref 0.44–1.00)
GFR calc Af Amer: >60 mL/min (ref >60)
GFR calc non Af Amer: >60 mL/min (ref >60)
GLUCOSE: 91 mg/dL (ref 70–99)
Potassium: 3.3 mmol/L — ABNORMAL LOW (ref 3.5–5.1)
Sodium: 140 mmol/L (ref 135–145)

## 2017-08-20 LAB — CBC
HCT: 37.3 % (ref 35.0–47.0)
HEMOGLOBIN: 13.1 g/dL (ref 12.0–16.0)
MCH: 32.8 pg (ref 26.0–34.0)
MCHC: 35 g/dL (ref 32.0–36.0)
MCV: 93.6 fL (ref 80.0–100.0)
PLATELETS: 310 10*3/uL (ref 150–440)
RBC: 3.99 MIL/uL (ref 3.80–5.20)
RDW: 12.9 % (ref 11.5–14.5)
WBC: 5.4 10*3/uL (ref 3.6–11.0)

## 2017-08-20 LAB — POCT PREGNANCY, URINE: Preg Test, Ur: NEGATIVE

## 2017-08-20 LAB — TSH: TSH: 1.235 u[IU]/mL (ref 0.350–4.500)

## 2017-08-20 MED ORDER — LORAZEPAM 0.5 MG PO TABS: 0.5000 mg | ORAL_TABLET | Freq: Three times a day (TID) | ORAL | 0 refills | Status: DC | PRN

## 2017-08-20 NOTE — ED Triage Notes (Signed)
Pt reports has had intermittent numbness for the past week and today she was driving and it happened again. Pt reports feels some pressure in her chest. Pt breathing heavy and fast in triage, crying. Advised pt to breath slowly.

## 2017-08-20 NOTE — ED Triage Notes (Signed)
Pt tearful in triage, sates that she is scared. Pt reports numbness is in both arms right now.

## 2017-08-20 NOTE — ED Provider Notes (Signed)
[facility] Emergency Department Provider Note    First MD Initiated Contact with Patient 08/20/17 1226     (approximate)  I have reviewed the triage vital signs and the nursing notes.   HISTORY  Chief Complaint Numbness and Chest Pain    HPI Stephanie Faulkner is a 32 y.o. female 3 of anxiety presents the ER with several episodes of brief intermittent rotations and generalized paresthesias and overwhelming sense of doom for the past several weeks.  States that these occur and last only about 45 minutes.  She states that she can feel them coming on.  She has not taken anything for this.  States that her boyfriend is currently in the hospital at [facility] and [place] car when she started feeling similar symptoms and felt her heart was making a "bubbling" sensation.    Past Medical History:  Diagnosis Date  . Abortion   . Anxiety   . History of D&C    Family History  Problem Relation Age of Onset  . Diabetes Mother   . Hypertension Mother    Past Surgical History:  Procedure Laterality Date  . CERVICAL CONE BIOPSY     Patient Active Problem List   Diagnosis Date Noted  . Pelvic pain 07/11/2017  . 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 Authorizing Provider  Norethin Ace-Eth Estrad-FE (TAYTULLA) 1-20 MG-MCG(24) CAPS Take 1 tablet by mouth daily. 07/11/17  Yes Nadara Mustard, MD  Doxylamine-Pyridoxine (DICLEGIS) 10-10 MG TBEC 2 tabs orally at hs (Day 1). If this works, continue taking 2 tab qhs. However, if nausea persists Day 2, take 2 tabs at bedtime that night then take three tablets starting on Day 3 (one tablet in the morning and two tablets at bedtime). If these 3 tablets control symptoms on Day 4, continue taking 3 tabs daily. Otherwise take 4 tabs starting on Day 4 (one tablet in the morning, one tablet mid-afternoon  and two tablets at bedtime). Patient not taking: Reported on 07/11/2017 05/21/17   Jeanmarie Plant, MD  HYDROcodone-acetaminophen (NORCO) 5-325 MG tablet Take 1 tablet by mouth every 6 (six) hours as needed for moderate pain. Patient not taking: Reported on 07/11/2017 07/11/17   Irean Hong, MD  ibuprofen (ADVIL,MOTRIN) 600 MG tablet Take 1 tablet (600 mg total) by mouth every 8 (eight) hours as needed. Patient not taking: Reported on 07/11/2017 07/11/17   Irean Hong, MD  LORazepam (ATIVAN) 0.5 MG tablet Take 1 tablet (0.5 mg total) by mouth every 8 (eight) hours as needed for anxiety. 08/20/17 08/20/18  Willy Eddy, MD  medroxyPROGESTERone (PROVERA) 10 MG tablet Take 1 tablet (10 mg total) by mouth daily for 10 days. 07/11/17 07/21/17  Nadara Mustard, MD  metroNIDAZOLE (FLAGYL) 500 MG tablet Take 1 tablet (500 mg total) by mouth 2 (two) times daily. Patient not taking: Reported on 06/13/2017 05/06/17   Bridget Hartshorn L, PA-C  penicillin v potassium (VEETID) 500 MG tablet Take 1 tablet (500 mg total) by mouth 4 (four) times daily. Patient not taking: Reported on 07/11/2017 06/07/17   [name], MD  PRENATAL 28-0.8 MG TABS 1 tablet daily. Patient not taking: Reported on 07/11/2017 02/18/17   Tommie Sams, DO  promethazine (PHENERGAN) 25 MG suppository Place 1 suppository (25 mg total) rectally every 6 (six) hours as needed  for nausea. Patient not taking: Reported on 07/11/2017 05/21/17 05/21/18  [name], James A, MD  promethazine (PHENERGAN) 25 MG tablet Take 1 tablet (25 mg total) by mouth every 6 (six) hours as needed for nausea or vomiting. Patient not taking: Reported on 07/11/2017 06/15/17   Emily FilbertWilliams, Jonathan E, MD    Allergies Patient has no known allergies.    Social History Social History   Tobacco Use  . Smoking status: Never Smoker  . Smokeless tobacco: Never Used  Substance Use Topics  . Alcohol use: No  . Drug use: Yes    Types: Marijuana    Comment: occasionally      Review of Systems Patient denies headaches, rhinorrhea, blurry vision, numbness, shortness of breath, chest pain, edema, cough, abdominal pain, nausea, vomiting, diarrhea, dysuria, fevers, rashes or hallucinations unless otherwise stated above in HPI. ____________________________________________   PHYSICAL EXAM:  VITAL SIGNS: Vitals:   08/20/17 1248 08/20/17 1315  BP: 126/86 131/87  Pulse: (!) 59 76  Resp: 18   SpO2: 100% 100%    Constitutional: Alert and oriented.  Eyes: Conjunctivae are normal.  Head: Atraumatic. Nose: No congestion/rhinnorhea. Mouth/Throat: Mucous membranes are moist.   Neck: No stridor. Painless ROM.  Cardiovascular: Normal rate, regular rhythm. Grossly normal heart sounds.  Good peripheral circulation. Respiratory: Normal respiratory effort.  No retractions. Lungs CTAB. Gastrointestinal: Soft and nontender. No distention. No abdominal bruits. No CVA tenderness. Genitourinary:  Musculoskeletal: No lower extremity tenderness nor edema.  No joint effusions. Neurologic:  Normal speech and language. No gross focal neurologic deficits are appreciated. No facial droop Skin:  Skin is warm, dry and intact. No rash noted. Psychiatric: Mood and affect are normal. Speech and behavior are normal.  ____________________________________________   LABS (all labs ordered are listed, but only abnormal results are displayed)  Results for orders placed or performed during the hospital encounter of 08/20/17 (from the past 24 hour(s))  Basic metabolic panel     Status: Abnormal   Collection Time: 08/20/17 10:53 AM  Result Value Ref Range   Sodium 140 135 - 145 mmol/L   Potassium 3.3 (L) 3.5 - 5.1 mmol/L   Chloride 108 98 - 111 mmol/L   CO2 23 22 - 32 mmol/L   Glucose, Bld 91 70 - 99 mg/dL   BUN 7 6 - 20 mg/dL   Creatinine, Ser 4.540.84 0.44 - 1.00 mg/dL   Calcium 9.3 8.9 - 09.810.3 mg/dL   GFR calc non Af Amer >60 >60 mL/min   GFR calc Af Amer >60 >60 mL/min   Anion gap  9 5 - 15  CBC     Status: None   Collection Time: 08/20/17 10:53 AM  Result Value Ref Range   WBC 5.4 3.6 - 11.0 K/uL   RBC 3.99 3.80 - 5.20 MIL/uL   Hemoglobin 13.1 12.0 - 16.0 g/dL   HCT 11.937.3 14.735.0 - 82.947.0 %   MCV 93.6 80.0 - 100.0 fL   MCH 32.8 26.0 - 34.0 pg   MCHC 35.0 32.0 - 36.0 g/dL   RDW 56.212.9 13.011.5 - 86.514.5 %   Platelets 310 150 - 440 K/uL  Troponin I     Status: None   Collection Time: 08/20/17 10:53 AM  Result Value Ref Range   Troponin I <0.03 <0.03 ng/mL  TSH     Status: None   Collection Time: 08/20/17 10:53 AM  Result Value Ref Range   TSH 1.235 0.350 - 4.500 uIU/mL  Pregnancy, urine POC  Status: None   Collection Time: 08/20/17 11:00 AM  Result Value Ref Range   Preg Test, Ur NEGATIVE NEGATIVE   ____________________________________________  EKG My review and personal interpretation at Time: 10:45   Indication: chest pain  Rate: 124  Rhythm: sinus Axis: normal Other: normal intervals, no stemi, nonspecific st and t wave abn unchanged from previous 9/18 ____________________________________________  RADIOLOGY  I personally reviewed all radiographic images ordered to evaluate for the above acute complaints and reviewed radiology reports and findings.  These findings were personally discussed with the patient.  Please see medical record for radiology report.  ____________________________________________   PROCEDURES  Procedure(s) performed:  Procedures    Critical Care performed: no ____________________________________________   INITIAL IMPRESSION / ASSESSMENT AND PLAN / ED COURSE  Pertinent labs & imaging results that were available during my care of the patient were reviewed by me and considered in my medical decision making (see chart for details).   DDX: ACS, pericarditis, esophagitis, anxiety, pna, bronchitis, costochondritis   Stephanie Faulkner is a 32 y.o. who presents to the ED with symptoms as described above.  Patient is AFVSS in ED. Exam as  above. Given current presentation have considered the above differential.  The patient will be placed on continuous pulse oximetry and telemetry for monitoring.  Laboratory evaluation will be sent to evaluate for the above complaints.  Unlikely ACS,  ekg unchanged from previous,  No pre-exitation syndrome. Trop negative.  Not consistent with PE.  Not consistent with dissection.  No focal neuro deficits.  Symptoms resolved upon arrival to the ER.  Most clinically consistent with stress reaction or panic attack.  She is not hypoxic.  TSH is normal.  She is asymptomatic I do believe she stable and appropriate for outpatient follow-up.     As part of my medical decision making, I reviewed the following data within the electronic MEDICAL RECORD NUMBER Nursing notes reviewed and incorporated, Labs reviewed, notes from prior ED visits.   ____________________________________________   FINAL CLINICAL IMPRESSION(S) / ED DIAGNOSES  Final diagnoses:  Paresthesia  Stress reaction      NEW MEDICATIONS STARTED DURING THIS VISIT:  New Prescriptions   LORAZEPAM (ATIVAN) 0.5 MG TABLET    Take 1 tablet (0.5 mg total) by mouth every 8 (eight) hours as needed for anxiety.     Note:  This document was prepared using Dragon voice recognition software and may include unintentional dictation errors.    Willy Eddyobinson, Estefania Kamiya, MD 08/20/17 [phone]

## 2017-08-20 NOTE — ED Notes (Signed)
Pt placed in SWA and given a warm blanket. Pt encouraged to use call light and informed to use call light if she feels worse or needs anything.

## 2017-08-20 NOTE — ED Notes (Signed)
First Nurse Note: Patient staggering in WR, states "I'm having a heart attack", clutching chest.  Taken to Triage.

## 2017-08-23 ENCOUNTER — Encounter: Payer: Self-pay | Admitting: Emergency Medicine

## 2017-08-23 ENCOUNTER — Emergency Department
Admission: EM | Admit: 2017-08-23 | Discharge: 2017-08-23 | Disposition: A | Discharge status: Home / Self Care | Payer: Medicaid Other | Attending: Emergency Medicine | Admitting: Emergency Medicine

## 2017-08-23 DIAGNOSIS — Z79899 Other long term (current) drug therapy: Secondary | ICD-10-CM | POA: Diagnosis not present

## 2017-08-23 DIAGNOSIS — F419 Anxiety disorder, unspecified: Secondary | ICD-10-CM | POA: Insufficient documentation

## 2017-08-23 DIAGNOSIS — R0602 Shortness of breath: Secondary | ICD-10-CM | POA: Diagnosis present

## 2017-08-23 NOTE — ED Notes (Signed)
Pt a/o, NAD. BS clear, no resp issues noted.

## 2017-08-23 NOTE — Discharge Instructions (Addendum)
Advised to start medication from previous visit and follow-up with clinic as directed from previous visit.

## 2017-08-23 NOTE — ED Triage Notes (Signed)
Pt reports intermittent episodes of SOB, HA and earaches for the past couple of weeks. Pt reports when that happens her hands and feet get sweaty. Pt stated that she googled the symptoms and is concerned she has a brain tumor or some type of deficiency. Pt states that she was seen here for the same 2 days ago and told she had anxiety.

## 2017-08-23 NOTE — ED Provider Notes (Signed)
[facility] Emergency Department Provider Note   ____________________________________________   First MD Initiated Contact with Patient 08/23/17 1258     (approximate)  I have reviewed the triage vital signs and the nursing notes.   HISTORY  Chief Complaint Shortness of Breath    HPI Stephanie Faulkner is a 32 y.o. female patient return back to the ED status post 2 days of being evaluated and diagnosed anxiety.  Patient still has symptoms has not resolved.  Patient also states she has not filled the medication prescribed for her anxiety from her last visit.  Patient states she does not believe in taking pills.  Patient states she grew her complaints last night and is concerned she might have a "brain tumor".  Patient also state there might be some type of hormonal deficiency.  Past Medical History:  Diagnosis Date  . Abortion   . Anxiety   . History of D&C     Patient Active Problem List   Diagnosis Date Noted  . Pelvic pain 07/11/2017  . 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 CERVICAL CONE BIOPSY      Prior to Admission medications   Medication Sig Start Date End Date Taking? Authorizing Provider  Doxylamine-Pyridoxine (DICLEGIS) 10-10 MG TBEC 2 tabs orally at hs (Day 1). If this works, continue taking 2 tab qhs. However, if nausea persists Day 2, take 2 tabs at bedtime that night then take three tablets starting on Day 3 (one tablet in the morning and two tablets at bedtime). If these 3 tablets control symptoms on Day 4, continue taking 3 tabs daily. Otherwise take 4 tabs starting on Day 4 (one tablet in the morning, one tablet mid-afternoon and two tablets at bedtime). Patient not taking: Reported on 07/11/2017 05/21/17   Jeanmarie Plant, MD  HYDROcodone-acetaminophen (NORCO) 5-325 MG tablet Take 1 tablet by mouth every 6 (six) hours as needed for moderate pain. Patient not taking: Reported on  07/11/2017 07/11/17   Irean Hong, MD  ibuprofen (ADVIL,MOTRIN) 600 MG tablet Take 1 tablet (600 mg total) by mouth every 8 (eight) hours as needed. Patient not taking: Reported on 07/11/2017 07/11/17   Irean Hong, MD  LORazepam (ATIVAN) 0.5 MG tablet Take 1 tablet (0.5 mg total) by mouth every 8 (eight) hours as needed for anxiety. 08/20/17 08/20/18  Willy Eddy, MD  medroxyPROGESTERone (PROVERA) 10 MG tablet Take 1 tablet (10 mg total) by mouth daily for 10 days. 07/11/17 07/21/17  Nadara Mustard, MD  metroNIDAZOLE (FLAGYL) 500 MG tablet Take 1 tablet (500 mg total) by mouth 2 (two) times daily. Patient not taking: Reported on 06/13/2017 05/06/17   Bridget Hartshorn L, PA-C  Norethin Ace-Eth Estrad-FE (TAYTULLA) 1-20 MG-MCG(24) CAPS Take 1 tablet by mouth daily. 07/11/17   Nadara Mustard, MD  penicillin v potassium (VEETID) 500 MG tablet Take 1 tablet (500 mg total) by mouth 4 (four) times daily. Patient not taking: Reported on 07/11/2017 06/07/17   [name], MD  PRENATAL 28-0.8 MG TABS 1 tablet daily. Patient not taking: Reported on 07/11/2017 02/18/17   Tommie Sams, DO  promethazine (PHENERGAN) 25 MG suppository Place 1 suppository (25 mg total) rectally every 6 (six) hours as needed for nausea. Patient not taking: Reported on 07/11/2017 05/21/17 05/21/18  Jeanmarie Plant, MD  promethazine (PHENERGAN) 25 MG tablet Take 1 tablet (25 mg total) by mouth every 6 (six) hours as needed for  nausea or vomiting. Patient not taking: Reported on 07/11/2017 06/15/17   Emily FilbertWilliams, Jonathan E, MD    Allergies Patient has no known allergies.  Family History  Problem Relation Age of Onset  . Diabetes Mother   . Hypertension Mother     Social History Social History   Tobacco Use  . Smoking status: Never Smoker  . Smokeless tobacco: Never Used  Substance Use Topics  . Alcohol use: No  . Drug use: Yes    Types: Marijuana    Comment: occasionally     Review of Systems Constitutional: No  fever/chills Eyes: No visual changes. ENT: No sore throat. Cardiovascular: Denies chest pain. Respiratory: Denies shortness of breath. Gastrointestinal: No abdominal pain.  No nausea, no vomiting.  No diarrhea.  No constipation. Genitourinary: Negative for dysuria. Musculoskeletal: Negative for back pain. Skin: Negative for rash. Neurological: Negative for headaches, focal weakness or numbness. Psychiatric:Anxiety ____________________________________________   PHYSICAL EXAM:  VITAL SIGNS: ED Triage Vitals  Enc Vitals Group     BP 08/23/17 1212 118/87     Pulse Rate 08/23/17 1212 77     Resp 08/23/17 1212 20     Temp 08/23/17 1212 98.6 F (37 C)     Temp Source 08/23/17 1212 Oral     SpO2 08/23/17 1212 100 %     Weight 08/23/17 1213 140 lb (63.5 kg)     Height 08/23/17 1213 5\' 7"  (1.702 m)     Head Circumference --      Peak Flow --      Pain Score 08/23/17 1213 2     Pain Loc --      Pain Edu? --      Excl. in GC? --    Constitutional: Alert and oriented.  Anxious.   Cardiovascular: Normal rate, regular rhythm. Grossly normal heart sounds.  Good peripheral circulation. Respiratory: Normal respiratory effort.  No retractions. Lungs CTAB. Musculoskeletal: No lower extremity tenderness nor edema.  No joint effusions. Neurologic:  Normal speech and language. No gross focal neurologic deficits are appreciated. No gait instability. Skin:  Skin is warm, dry and intact. No rash noted. Psychiatric: Anxious speech and behavior  ____________________________________________   LABS (all labs ordered are listed, but only abnormal results are displayed)  Labs Reviewed - No data to display ____________________________________________  EKG   ____________________________________________  RADIOLOGY  ED MD interpretation:    Official radiology report(s): No results found.  ____________________________________________   PROCEDURES  Procedure(s) performed:  None  Procedures  Critical Care performed: No  ____________________________________________   INITIAL IMPRESSION / ASSESSMENT AND PLAN / ED COURSE  As part of my medical decision making, I reviewed the following data within the electronic MEDICAL RECORD NUMBER   Patient presents with continued anxiety and panic attacks.  Discussed with patient rationale for giving medication chance to see would resolve her complaints.  I advised patient that she needs to follow-up with the clinic in case she need to be titrated up or down on this medication.  Patient voices understanding of discharge care instructions.      ____________________________________________   FINAL CLINICAL IMPRESSION(S) / ED DIAGNOSES  Final diagnoses:  Anxiety     ED Discharge Orders    None       Note:  This document was prepared using Dragon voice recognition software and may include unintentional dictation errors.    Joni ReiningSmith, Aikeem Lilley K, PA-C 08/23/17 1336    Jene EveryKinner, Robert, MD 08/23/17 [phone]

## 2017-08-29 ENCOUNTER — Emergency Department
Admission: EM | Admit: 2017-08-29 | Discharge: 2017-08-29 | Disposition: A | Discharge status: Home / Self Care | Payer: Medicaid Other | Attending: Emergency Medicine | Admitting: Emergency Medicine

## 2017-08-29 ENCOUNTER — Emergency Department: Payer: Medicaid Other

## 2017-08-29 ENCOUNTER — Other Ambulatory Visit: Payer: Self-pay

## 2017-08-29 DIAGNOSIS — R002 Palpitations: Secondary | ICD-10-CM | POA: Insufficient documentation

## 2017-08-29 DIAGNOSIS — R0789 Other chest pain: Secondary | ICD-10-CM | POA: Insufficient documentation

## 2017-08-29 DIAGNOSIS — R079 Chest pain, unspecified: Secondary | ICD-10-CM

## 2017-08-29 DIAGNOSIS — Z79899 Other long term (current) drug therapy: Secondary | ICD-10-CM | POA: Insufficient documentation

## 2017-08-29 LAB — CBC
HEMATOCRIT: 35.3 % (ref 35.0–47.0)
Hemoglobin: 12.2 g/dL (ref 12.0–16.0)
MCH: 32.2 pg (ref 26.0–34.0)
MCHC: 34.6 g/dL (ref 32.0–36.0)
MCV: 92.9 fL (ref 80.0–100.0)
Platelets: 290 10*3/uL (ref 150–440)
RBC: 3.8 MIL/uL (ref 3.80–5.20)
RDW: 12.7 % (ref 11.5–14.5)
WBC: 6.5 10*3/uL (ref 3.6–11.0)

## 2017-08-29 LAB — BASIC METABOLIC PANEL
Anion gap: 4 — ABNORMAL LOW (ref 5–15)
BUN: 7 mg/dL (ref 6–20)
CHLORIDE: 111 mmol/L (ref 98–111)
CO2: 25 mmol/L (ref 22–32)
Calcium: 9.1 mg/dL (ref 8.9–10.3)
Creatinine, Ser: 0.67 mg/dL (ref 0.44–1.00)
GFR calc non Af Amer: >60 mL/min (ref >60)
Glucose, Bld: 78 mg/dL (ref 70–99)
POTASSIUM: 3.6 mmol/L (ref 3.5–5.1)
SODIUM: 140 mmol/L (ref 135–145)

## 2017-08-29 LAB — TROPONIN I: Troponin I: <0.03 ng/mL (ref <0.03)

## 2017-08-29 NOTE — ED Notes (Signed)
NAD noted at time of D/C. Pt denies questions or concerns. Pt ambulatory to the lobby at this time.  

## 2017-08-29 NOTE — ED Provider Notes (Signed)
[facility] Emergency Department Provider Note  Time seen: 9:55 PM  I have reviewed the triage vital signs and the nursing notes.   HISTORY  Chief Complaint Chest Pain    HPI Stephanie Faulkner is a 32 y.o. female with a past medical history of anxiety presents to the emergency department for chest pain and trouble breathing.  According to the patient approximately 5 hours ago she developed pain in her chest which she describes more as a tightness sensation in difficulty taking a big deep breath of air.  Patient has had similar symptoms in the past and has been told is likely due to anxiety.  Took a Ativan tablet which she was prescribed last week.  States the pain did go away but it took almost 2 hours, so she decided to come to the ER for evaluation.  Currently denies any chest pain or trouble breathing.  States over the past couple days she is also felt intermittent palpitations.  No leg pain or swelling.  No abdominal pain.  Largely negative review of systems otherwise.   Past Medical History:  Diagnosis Date  . Abortion   . Anxiety   . History of D&C     Patient Active Problem List   Diagnosis Date Noted  . Pelvic pain 07/11/2017  . 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 CERVICAL CONE BIOPSY      Prior to Admission medications   Medication Sig Start Date End Date Taking? Authorizing Provider  Doxylamine-Pyridoxine (DICLEGIS) 10-10 MG TBEC 2 tabs orally at hs (Day 1). If this works, continue taking 2 tab qhs. However, if nausea persists Day 2, take 2 tabs at bedtime that night then take three tablets starting on Day 3 (one tablet in the morning and two tablets at bedtime). If these 3 tablets control symptoms on Day 4, continue taking 3 tabs daily. Otherwise take 4 tabs starting on Day 4 (one tablet in the morning, one tablet mid-afternoon and two tablets at bedtime). Patient not taking: Reported on  07/11/2017 05/21/17   [name], James A, MD  HYDROcodone-acetaminophen (NORCO) 5-325 MG tablet Take 1 tablet by mouth every 6 (six) hours as needed for moderate pain. Patient not taking: Reported on 07/11/2017 07/11/17   Irean HongSung, Jade J, MD  ibuprofen (ADVIL,MOTRIN) 600 MG tablet Take 1 tablet (600 mg total) by mouth every 8 (eight) hours as needed. Patient not taking: Reported on 07/11/2017 07/11/17   Irean HongSung, Jade J, MD  LORazepam (ATIVAN) 0.5 MG tablet Take 1 tablet (0.5 mg total) by mouth every 8 (eight) hours as needed for anxiety. 08/20/17 08/20/18  Willy Eddyobinson, Patrick, MD  medroxyPROGESTERone (PROVERA) 10 MG tablet Take 1 tablet (10 mg total) by mouth daily for 10 days. 07/11/17 07/21/17  Nadara MustardHarris, Robert P, MD  metroNIDAZOLE (FLAGYL) 500 MG tablet Take 1 tablet (500 mg total) by mouth 2 (two) times daily. Patient not taking: Reported on 06/13/2017 05/06/17   Bridget HartshornSummers, Rhonda L, PA-C  Norethin Ace-Eth Estrad-FE (TAYTULLA) 1-20 MG-MCG(24) CAPS Take 1 tablet by mouth daily. 07/11/17   Nadara MustardHarris, Robert P, MD  penicillin v potassium (VEETID) 500 MG tablet Take 1 tablet (500 mg total) by mouth 4 (four) times daily. Patient not taking: Reported on 07/11/2017 06/07/17   Arnaldo NatalMalinda, Paul F, MD  PRENATAL 28-0.8 MG TABS 1 tablet daily. Patient not taking: Reported on 07/11/2017 02/18/17   [name], [name], DO  promethazine (PHENERGAN) 25 MG suppository Place 1 suppository (25  mg total) rectally every 6 (six) hours as needed for nausea. Patient not taking: Reported on 07/11/2017 05/21/17 05/21/18  Jeanmarie Plant, MD  promethazine (PHENERGAN) 25 MG tablet Take 1 tablet (25 mg total) by mouth every 6 (six) hours as needed for nausea or vomiting. Patient not taking: Reported on 07/11/2017 06/15/17   Emily Filbert, MD    No Known Allergies  Family History  Problem Relation Age of Onset  . Diabetes Mother   . Hypertension Mother     Social History Social History   Tobacco Use  . Smoking status: Never Smoker  . Smokeless  tobacco: Never Used  Substance Use Topics  . Alcohol use: No  . Drug use: Yes    Types: Marijuana    Comment: occasionally     Review of Systems Constitutional: Negative for fever. Cardiovascular: Positive for chest pain/tightness now resolved.  Positive for palpitations, none currently Respiratory: Positive for feeling like she could not get a big deep breath of air, no shortness of breath currently. Gastrointestinal: Negative for abdominal pain, vomiting and diarrhea. Musculoskeletal: Negative for leg pain or swelling Skin: Negative for skin complaints  Neurological: Negative for headache All other ROS negative  ____________________________________________   PHYSICAL EXAM:  VITAL SIGNS: ED Triage Vitals  Enc Vitals Group     BP 08/29/17 2002 128/81     Pulse Rate 08/29/17 2002 65     Resp 08/29/17 2002 17     Temp 08/29/17 2002 99.3 F (37.4 C)     Temp Source 08/29/17 2002 Oral     SpO2 08/29/17 2002 100 %     Weight 08/29/17 2000 135 lb (61.2 kg)     Height 08/29/17 2000 5\' 7"  (1.702 m)     Head Circumference --      Peak Flow --      Pain Score 08/29/17 2000 2     Pain Loc --      Pain Edu? --      Excl. in GC? --    Constitutional: Alert and oriented. Well appearing and in no distress. Eyes: Normal exam ENT   Head: Normocephalic and atraumatic.   Mouth/Throat: Mucous membranes are moist. Cardiovascular: Normal rate, regular rhythm. No murmur Respiratory: Normal respiratory effort without tachypnea nor retractions. Breath sounds are clear  Gastrointestinal: Soft and nontender. No distention. Musculoskeletal: Nontender with normal range of motion in all extremities. No lower extremity tenderness or edema. Neurologic:  Normal speech and language. No gross focal neurologic deficits are appreciated. Skin:  Skin is warm, dry and intact.  Psychiatric: Mood and affect are normal. Speech and behavior are normal.    ____________________________________________    EKG  EKG reviewed and interpreted by myself shows normal sinus rhythm at 66 bpm with a narrow QRS, normal axis, normal intervals, no concerning ST changes.  ____________________________________________    RADIOLOGY  Chest x-ray negative  ____________________________________________   INITIAL IMPRESSION / ASSESSMENT AND PLAN / ED COURSE  Pertinent labs & imaging results that were available during my care of the patient were reviewed by me and considered in my medical decision making (see chart for details).  Patient presents to the emergency department for chest discomfort and palpitations which have since resolved.  Differential would include ACS, palpitations, PVCs, PACs, anxiety.  Overall the patient appears very well denies any symptoms at this time.  Has a very normal and reassuring physical examination.  Well-appearing EKG, chest x-ray is normal and labs have resulted normal as  well including a negative troponin.  As the patient appears very well and has a very normal work-up I believe the patient is safe for discharge home with PCP follow-up.  Patient agreeable to plan of care.  ____________________________________________   FINAL CLINICAL IMPRESSION(S) / ED DIAGNOSES  Chest pain Palpitations    Minna Antis, MD 08/29/17 2157

## 2017-08-29 NOTE — ED Triage Notes (Signed)
Patient c/o left chest pain described as cramping with radiation to back beginning 3-4 hours ago. Patient took Ativan at 1500, with no relief of symptoms. Patient reports accompanying symptom of SOB, denies other symptoms.

## 2017-09-09 ENCOUNTER — Ambulatory Visit
Admission: EM | Admit: 2017-09-09 | Discharge: 2017-09-09 | Disposition: A | Discharge status: Home / Self Care | Payer: Medicaid Other | Attending: Emergency Medicine | Admitting: Emergency Medicine

## 2017-09-09 ENCOUNTER — Other Ambulatory Visit: Payer: Self-pay

## 2017-09-09 ENCOUNTER — Encounter: Payer: Self-pay | Admitting: Emergency Medicine

## 2017-09-09 DIAGNOSIS — K0889 Other specified disorders of teeth and supporting structures: Secondary | ICD-10-CM

## 2017-09-09 DIAGNOSIS — G44209 Tension-type headache, unspecified, not intractable: Secondary | ICD-10-CM

## 2017-09-09 MED ORDER — IBUPROFEN 600 MG PO TABS: 600.0000 mg | ORAL_TABLET | Freq: Four times a day (QID) | ORAL | 0 refills | Status: DC | PRN

## 2017-09-09 MED ORDER — ACETAMINOPHEN 500 MG PO TABS
1000.0000 mg | ORAL_TABLET | Freq: Once | ORAL | Status: AC
  Administered 2017-09-09: 1000 mg via ORAL

## 2017-09-09 MED ORDER — KETOROLAC TROMETHAMINE 60 MG/2ML IM SOLN
30.0000 mg | Freq: Once | INTRAMUSCULAR | Status: AC
Start: 2017-09-09 — End: 2017-09-09
  Administered 2017-09-09: 30 mg via INTRAMUSCULAR

## 2017-09-09 MED ORDER — PENICILLIN V POTASSIUM 500 MG PO TABS: 500.0000 mg | ORAL_TABLET | Freq: Four times a day (QID) | ORAL | 0 refills | Status: AC

## 2017-09-09 MED ORDER — DEXAMETHASONE SODIUM PHOSPHATE 10 MG/ML IJ SOLN
10.0000 mg | Freq: Once | INTRAMUSCULAR | Status: AC
  Administered 2017-09-09: 10 mg via INTRAMUSCULAR

## 2017-09-09 MED ORDER — DIPHENHYDRAMINE HCL 50 MG PO CAPS
50.0000 mg | ORAL_CAPSULE | Freq: Once | ORAL | Status: AC
  Administered 2017-09-09: 50 mg via ORAL

## 2017-09-09 NOTE — ED Provider Notes (Signed)
HPI  SUBJECTIVE:  Stephanie Faulkner is a 32 y.o. female who reports  gradual onset constant posterior and frontal headache described as "a band around my head" for the past 5 days. Reports phonophobia.  She tried Tylenol thousand milligrams last night without improvement in her symptoms.  States that the headache is worse in the morning when she wakes up. No fevers, N/V, photophobia, visual changes, purulent nasal d/c, nasal congestion, sinus pain/pressure, ear pain, jaw pain, dysarthria, focal weakness, facial droop, discoordination. No body aches, neck stiffness, rash. No mental status changes, seizures, syncope. Denies eye strain. Wears glasses at night for diving.  Pt is not pregnant. No sudden onset, did not occur during exertion. states has never had HA before.  Past medical history negative for aneurysm, HIV, sinusitis, glaucoma, stroke, atrial fibrillation or temporal arteritis. Pt is not on any antiplatelets/anticoagulants No antipyretic in the past 6 to 8 hours. FH neg stroke. PMH anxiety. NO h/o DM, HTN. LMP: 9/7. No possibility of pregnancy. PMD: Dr. Illene Regulus.  Pt also reports left lower Dental pain starting today, states that Filling fell out.  Plans to see a dentist for this but is requesting antibiotics.   Past Medical History:  Diagnosis Date  . Abortion   . Anxiety   . History of D&C     Past Surgical History:  Procedure Laterality Date  . CERVICAL CONE BIOPSY      Family History  Problem Relation Age of Onset  . Diabetes Mother   . Hypertension Mother   . Healthy Father     Social History   Tobacco Use  . Smoking status: Never Smoker  . Smokeless tobacco: Never Used  Substance Use Topics  . Alcohol use: No  . Drug use: Not Currently    Types: Marijuana    Comment: occasionally     No current facility-administered medications for this encounter.   Current Outpatient Medications:  .  LORazepam (ATIVAN) 0.5 MG tablet, Take 1 tablet (0.5 mg total) by mouth  every 8 (eight) hours as needed for anxiety., Disp: 6 tablet, Rfl: 0 .  ibuprofen (ADVIL,MOTRIN) 600 MG tablet, Take 1 tablet (600 mg total) by mouth every 6 (six) hours as needed., Disp: 30 tablet, Rfl: 0 .  penicillin v potassium (VEETID) 500 MG tablet, Take 1 tablet (500 mg total) by mouth 4 (four) times daily for 7 days., Disp: 28 tablet, Rfl: 0  No Known Allergies   ROS  As noted in HPI.   Physical Exam  BP (!) 126/96 (BP Location: Left Arm)   Pulse 81   Temp 98.4 F (36.9 C) (Oral)   Resp 16   Ht 5\' 7"  (1.702 m)   Wt 62.6 kg   LMP 09/07/2017   SpO2 100%   BMI 21.61 kg/m   Constitutional: Well developed, well nourished, no acute distress Eyes: PERRL, EOMI, conjunctiva normal bilaterally. Fundoscopic normal b/l. HENT: Normocephalic, atraumatic,mucus membranes moist,  TM normal b/l.  Normal dentition. tooth 22 lower right cuspid filling missing, mildly TTP. Normal gingiva.  No nasal congestion, no sinus tenderness. No temporal artery tenderness.  Neck: no cervical LN - trapezial muscle tenderness. No meningismus. + tenderness at occiput.  Respiratory: normal inspiratory effort Cardiovascular: Normal rate regular rhythm GI:  nondistended skin: No rash, skin intact Musculoskeletal: No edema, no tenderness, no deformities Neurologic: Alert & oriented x 3, CN II-XII intact, romberg neg, finger-> nose, heel-> shin equal b/l, Romberg neg, tandem gait steady Psychiatric: Speech and behavior appropriate  ED Course   Medications  acetaminophen (TYLENOL) tablet 1,000 mg (1,000 mg Oral Given 09/09/17 0904)  ketorolac (TORADOL) injection 30 mg (30 mg Intramuscular Given 09/09/17 0904)  diphenhydrAMINE (BENADRYL) capsule 50 mg (50 mg Oral Given 09/09/17 0903)  dexamethasone (DECADRON) injection 10 mg (10 mg Intramuscular Given 09/09/17 0906)    No orders of the defined types were placed in this encounter.  No results found for this or any previous visit (from the past 24  hour(s)). No results found.   ED Clinical Impression  Acute non intractable tension-type headache  Pain, dental  ED Assessment/Plan  Presentation consistent with a musculoskeletal/tension headache.  She also appears anxious, so I suspect that her anxiety may be contributing to this.  She is concerned about a possible brain mass or swelling, but I think that this is highly unlikely.  No sudden onset. Doubt SAH, ICH or space occupying lesion. Pt without fevers/chills, Pt has no meningeal sx, no nuchal rigidity. Doubt meningitis. Pt with normal neuro exam, no evidence of CVA/TIA.  Pt BP not elevated significantly, doubt hypertensive emergency. No evidence of temporal artery tenderness, no evidence of glaucoma or other ocular pathology. Will give modified headache cocktail (dexamethasone 10 IM, Toradol 30 IM, Benadryl 50 p.o., Tylenol thousand milligrams), and reassess.  No Compazine or Reglan as she has no nausea or vomiting.  Previous ER records reviewed.  Seen in the ER on 8/23 for anxiety and 8/29 for chest pain.  No records of headache.  Pt much improved after medications. Pt with continued non-focal neuro exam. Will d/c home with nsaid/Tylenol, and have pt F/U with PCP of choice.  Providing primary care referral list1.  Will send home with penicillin, Listerine rinses and she will need to follow-up with her dentist for the dental pain.  Do not think is related to the headache as a dental pain started today. Discussed MDM, plan for follow up, signs and sx that should prompt return to ER. Pt agrees with plan  Meds ordered this encounter  Medications  . acetaminophen (TYLENOL) tablet 1,000 mg  . ketorolac (TORADOL) injection 30 mg  . diphenhydrAMINE (BENADRYL) capsule 50 mg  . dexamethasone (DECADRON) injection 10 mg  . penicillin v potassium (VEETID) 500 MG tablet    Sig: Take 1 tablet (500 mg total) by mouth 4 (four) times daily for 7 days.    Dispense:  28 tablet    Refill:  0  .  ibuprofen (ADVIL,MOTRIN) 600 MG tablet    Sig: Take 1 tablet (600 mg total) by mouth every 6 (six) hours as needed.    Dispense:  30 tablet    Refill:  0    *This clinic note was created using Scientist, clinical (histocompatibility and immunogenetics). Therefore, there may be occasional mistakes despite careful proofreading.  ?   Domenick Gong, MD 09/10/17 [phone]

## 2017-09-09 NOTE — ED Triage Notes (Signed)
Patient in today c/o headache x 5 days.

## 2017-09-09 NOTE — Discharge Instructions (Addendum)
penicillin, Listerine rinses and she will need to follow-up with your dentist for the dental pain.   600 mg ibuprofen take with 1 g of Tylenol together 3 or 4 times a day as needed for headache.  Here is a list of primary care providers who are taking new patients:  Dr. Elizabeth Sauer, Dr. Schuyler Amor [address] [place] [address] [phone]  [facility] [address] [place] [address]  [phone]  [facility] [address] [place], [place] [address] [phone]  [facility] [address] [place] [address] [place]  [phone] [place], [place] [address]  Here are clinics/ other resources who will see you if you do not have insurance. Some have certain criteria that you must meet. Call them and find out what they are:  [facility]: [address] [place], [place] [address] Phone: [phone] Hours: First and Third Saturdays of each Month, 9 a.m. - 1 p.m.  [facility]: [address], [address] [place], [place] [address] Phone: [phone] Hours: Tuesday, 4 p.m. - 8 p.m. Thursday, 1 p.m. - 8 p.m. Wednesday, 9 a.m. - [facility] [address], [place], [place] [address] Phone: [phone] Pharmacy Phone Number: [phone] Dental Phone Number: [phone] [facility] Insurance Help: [phone]  Dental Hours: Monday - Thursday, 8 a.m. - 6 p.m.  [facility] [address], [place], [place] [address] Phone: [phone] Pharmacy Phone Number: [phone] [facility] Insurance Help: [phone]  [facility] [address] [place] [place], [place] [address] Phone: [phone] Pharmacy Phone Number: [phone] [facility] Insurance Help: [phone]  [facility] [address] [place], [place] [address] Phone: [phone] [facility] Insurance Help: [phone]   [facility] [address], [place], [place] [address] Phone:  [phone]  Go to www.goodrx.com to look up your medications. This will give you a list of where you can find your prescriptions at the most affordable prices. Or ask the pharmacist what the cash price is, or if they have any other discount programs available to help make your medication more affordable. This can be less expensive than what you would pay with insurance.

## 2017-09-12 ENCOUNTER — Emergency Department
Admission: EM | Admit: 2017-09-12 | Discharge: 2017-09-12 | Disposition: A | Discharge status: Home / Self Care | Payer: Medicaid Other | Attending: Emergency Medicine | Admitting: Emergency Medicine

## 2017-09-12 ENCOUNTER — Other Ambulatory Visit: Payer: Self-pay

## 2017-09-12 ENCOUNTER — Encounter: Payer: Self-pay | Admitting: Emergency Medicine

## 2017-09-12 DIAGNOSIS — R519 Headache, unspecified: Secondary | ICD-10-CM

## 2017-09-12 DIAGNOSIS — R51 Headache: Secondary | ICD-10-CM | POA: Diagnosis not present

## 2017-09-12 LAB — CBC
HCT: 33.1 % — ABNORMAL LOW (ref 35.0–47.0)
Hemoglobin: 11.5 g/dL — ABNORMAL LOW (ref 12.0–16.0)
MCH: 32.4 pg (ref 26.0–34.0)
MCHC: 34.9 g/dL (ref 32.0–36.0)
MCV: 93 fL (ref 80.0–100.0)
PLATELETS: 264 10*3/uL (ref 150–440)
RBC: 3.56 MIL/uL — ABNORMAL LOW (ref 3.80–5.20)
RDW: 12.7 % (ref 11.5–14.5)
WBC: 5.6 10*3/uL (ref 3.6–11.0)

## 2017-09-12 LAB — BASIC METABOLIC PANEL
Anion gap: 6 (ref 5–15)
BUN: 8 mg/dL (ref 6–20)
CALCIUM: 8.9 mg/dL (ref 8.9–10.3)
CO2: 28 mmol/L (ref 22–32)
CREATININE: 0.74 mg/dL (ref 0.44–1.00)
Chloride: 107 mmol/L (ref 98–111)
GFR calc Af Amer: >60 mL/min (ref >60)
GFR calc non Af Amer: >60 mL/min (ref >60)
GLUCOSE: 91 mg/dL (ref 70–99)
Potassium: 3.4 mmol/L — ABNORMAL LOW (ref 3.5–5.1)
Sodium: 141 mmol/L (ref 135–145)

## 2017-09-12 LAB — TROPONIN I

## 2017-09-12 LAB — POCT PREGNANCY, URINE: Preg Test, Ur: NEGATIVE

## 2017-09-12 MED ORDER — KETOROLAC TROMETHAMINE 30 MG/ML IJ SOLN
30.0000 mg | Freq: Once | INTRAMUSCULAR | Status: AC
  Administered 2017-09-12: 30 mg via INTRAMUSCULAR
  Filled 2017-09-12: qty 1

## 2017-09-12 NOTE — ED Triage Notes (Signed)
Says headache back of head which she has been seen multiple times for, but it keeps coming back.  Says she had ct yesterday and [facility].  [facility].  Patient brought from work by [employer].  Says the pain radiates down into chest.  ekg completed.

## 2017-09-12 NOTE — Discharge Instructions (Addendum)
You have been seen in the Emergency Department (ED) for a headache.   ° °As we have discussed, please follow up with your primary care doctor as soon as possible regarding today?s Emergency Department (ED) visit and your headache symptoms.   ° °Call your doctor or return to the ED if you have a worsening headache, sudden and severe headache, confusion, slurred speech, facial droop, weakness or numbness in any arm or leg, extreme fatigue, vision problems, or other symptoms that concern you. ° °

## 2017-09-12 NOTE — ED Provider Notes (Signed)
[facility] Emergency Department Provider Note   ____________________________________________   First MD Initiated Contact with Patient 09/12/17 1446     (approximate)  I have reviewed the triage vital signs and the nursing notes.   HISTORY  Chief Complaint Chest Pain and Headache    HPI Stephanie Faulkner is a 32 y.o. female here for evaluation of a headache  For about 2 to 3 weeks patient is experiencing intermittent headache over the back of the scalp.  Reports its almost always the back of the lower part of her scalp.  No neck pain.  No nausea or vomiting.  Occasionally the pain shoots towards her upper chest, but not causing any chest pain  Reports she went to urgent care, felt better after the medicines they gave her, and the headaches seem to come back and she went and was seen at [facility] yesterday.  She continues to experience a headache, was given a muscle relaxant and told to massage and rest but was not able to because of work today.  Light and sound seem to make the headache slightly worse.  No fevers or chills.  No neck stiffness.  No chest pain.  No trouble breathing.  Past Medical History:  Diagnosis Date  . Abortion   . Anxiety   . History of D&C     Patient Active Problem List   Diagnosis Date Noted  . Pelvic pain 07/11/2017  . 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 CERVICAL CONE BIOPSY      Prior to Admission medications   Medication Sig Start Date End Date Taking? Authorizing Provider  ibuprofen (ADVIL,MOTRIN) 600 MG tablet Take 1 tablet (600 mg total) by mouth every 6 (six) hours as needed. 09/09/17   Domenick Gong, MD  LORazepam (ATIVAN) 0.5 MG tablet Take 1 tablet (0.5 mg total) by mouth every 8 (eight) hours as needed for anxiety. 08/20/17 08/20/18  Willy Eddy, MD  penicillin v potassium (VEETID) 500 MG tablet Take 1 tablet (500 mg total) by mouth 4 (four) times daily  for 7 days. 09/09/17 09/16/17  Domenick Gong, MD    Allergies Patient has no known allergies.  Family History  Problem Relation Age of Onset  . Diabetes Mother   . Hypertension Mother   . Healthy Father     Social History Social History   Tobacco Use  . Smoking status: Never Smoker  . Smokeless tobacco: Never Used  Substance Use Topics  . Alcohol use: No  . Drug use: Not Currently    Types: Marijuana    Comment: occasionally     Review of Systems Constitutional: No fever/chills Eyes: No visual changes. ENT: No sore throat. Cardiovascular: Denies chest pain.  See HPI Respiratory: Denies shortness of breath. Gastrointestinal: No abdominal pain.  No nausea, no vomiting.  No diarrhea.  No constipation. Genitourinary: Negative for dysuria. Musculoskeletal: Negative for back pain. Skin: Negative for rash. Neurological: Negative for  focal weakness or numbness.    ____________________________________________   PHYSICAL EXAM:  VITAL SIGNS: ED Triage Vitals  Enc Vitals Group     BP 09/12/17 1321 124/77     Pulse Rate 09/12/17 1321 79     Resp 09/12/17 1321 14     Temp 09/12/17 1321 98.8 F (37.1 C)     Temp Source 09/12/17 1321 Oral     SpO2 09/12/17 1321 100 %     Weight 09/12/17 1322 137 lb 12.6 oz (  62.5 kg)     Height 09/12/17 1322 5\' 7"  (1.702 m)     Head Circumference --      Peak Flow --      Pain Score 09/12/17 1322 6     Pain Loc --      Pain Edu? --      Excl. in GC? --     Constitutional: Alert and oriented. Well appearing and in no acute distress. Eyes: Conjunctivae are normal. Head: Atraumatic. Nose: No congestion/rhinnorhea. Mouth/Throat: Mucous membranes are moist. Neck: No stridor.  No neck pain or meningismus. Cardiovascular: Normal rate, regular rhythm. Grossly normal heart sounds.  Good peripheral circulation. Respiratory: Normal respiratory effort.  No retractions. Lungs CTAB. Gastrointestinal: Soft and nontender. No  distention. Musculoskeletal: No lower extremity tenderness nor edema. Neurologic:  Normal speech and language. No gross focal neurologic deficits are appreciated.  Normal cranial nerve exam.  Fully alert and oriented.  No ataxia.  No pronator drift in extremities.  Speech is clear.  Reports some discomfort to palpation along the bilateral posterior occiput.  No lesions. Skin:  Skin is warm, dry and intact. No rash noted. Psychiatric: Mood and affect are normal. Speech and behavior are normal.  ____________________________________________   LABS (all labs ordered are listed, but only abnormal results are displayed)  Labs Reviewed  BASIC METABOLIC PANEL - Abnormal; Notable for the following components:      Result Value   Potassium 3.4 (*)    All other components within normal limits  CBC - Abnormal; Notable for the following components:   RBC 3.56 (*)    Hemoglobin 11.5 (*)    HCT 33.1 (*)    All other components within normal limits  TROPONIN I  POCT PREGNANCY, URINE  POC URINE PREG, ED   ____________________________________________  EKG   ____________________________________________  RADIOLOGY  [facility] the head yesterday ____________________________________________   PROCEDURES  Procedure(s) performed: None  Procedures  Critical Care performed: No  ____________________________________________   INITIAL IMPRESSION / ASSESSMENT AND PLAN / ED COURSE  Pertinent labs & imaging results that were available during my care of the patient were reviewed by me and considered in my medical decision making (see chart for details).  Clinically reassuring exam.  Consistent with tension-like headache.  Discussed with patient, will treat with Toradol, she will fill her prescription for muscle relaxant and plan to utilize medication previously prescribed.  She has appointment for follow-up tomorrow with her primary care  doctor.  No noted red flags.  CT imaging negative.  No risk factors for venous sinus thrombosis.  Normal neurologic exam at this time.  No evidence of infectious etiology or meningitis.  Return precautions and treatment recommendations and follow-up discussed with the patient who is agreeable with the plan.       ____________________________________________   FINAL CLINICAL IMPRESSION(S) / ED DIAGNOSES  Final diagnoses:  Recurrent occipital headache      NEW MEDICATIONS STARTED DURING THIS VISIT:  Discharge Medication List as of 09/12/2017  3:09 PM       Note:  This document was prepared using Dragon voice recognition software and may include unintentional dictation errors.     Sharyn CreamerQuale, Tywon Niday, MD 09/12/17 [phone]

## 2017-12-06 ENCOUNTER — Encounter: Payer: Self-pay | Admitting: Emergency Medicine

## 2017-12-06 ENCOUNTER — Ambulatory Visit
Admission: EM | Admit: 2017-12-06 | Discharge: 2017-12-06 | Disposition: A | Discharge status: Home / Self Care | Payer: Medicaid Other | Attending: Family Medicine | Admitting: Family Medicine

## 2017-12-06 ENCOUNTER — Other Ambulatory Visit: Payer: Self-pay

## 2017-12-06 DIAGNOSIS — Z8249 Family history of ischemic heart disease and other diseases of the circulatory system: Secondary | ICD-10-CM | POA: Diagnosis not present

## 2017-12-06 DIAGNOSIS — Z113 Encounter for screening for infections with a predominantly sexual mode of transmission: Secondary | ICD-10-CM

## 2017-12-06 DIAGNOSIS — F329 Major depressive disorder, single episode, unspecified: Secondary | ICD-10-CM | POA: Insufficient documentation

## 2017-12-06 DIAGNOSIS — Z833 Family history of diabetes mellitus: Secondary | ICD-10-CM | POA: Insufficient documentation

## 2017-12-06 DIAGNOSIS — F419 Anxiety disorder, unspecified: Secondary | ICD-10-CM | POA: Insufficient documentation

## 2017-12-06 DIAGNOSIS — B373 Candidiasis of vulva and vagina: Secondary | ICD-10-CM | POA: Insufficient documentation

## 2017-12-06 DIAGNOSIS — R300 Dysuria: Secondary | ICD-10-CM | POA: Diagnosis not present

## 2017-12-06 DIAGNOSIS — N898 Other specified noninflammatory disorders of vagina: Secondary | ICD-10-CM | POA: Diagnosis present

## 2017-12-06 DIAGNOSIS — N760 Acute vaginitis: Secondary | ICD-10-CM

## 2017-12-06 DIAGNOSIS — B9689 Other specified bacterial agents as the cause of diseases classified elsewhere: Secondary | ICD-10-CM

## 2017-12-06 DIAGNOSIS — B3731 Acute candidiasis of vulva and vagina: Secondary | ICD-10-CM

## 2017-12-06 DIAGNOSIS — Z79899 Other long term (current) drug therapy: Secondary | ICD-10-CM | POA: Insufficient documentation

## 2017-12-06 HISTORY — DX: Major depressive disorder, single episode, unspecified: F32.9

## 2017-12-06 HISTORY — DX: Depression, unspecified: F32.A

## 2017-12-06 LAB — CHLAMYDIA/NGC RT PCR (ARMC ONLY)
CHLAMYDIA TR: NOT DETECTED
N gonorrhoeae: NOT DETECTED

## 2017-12-06 LAB — URINALYSIS, COMPLETE (UACMP) WITH MICROSCOPIC
Bilirubin Urine: NEGATIVE
Glucose, UA: NEGATIVE mg/dL
Hgb urine dipstick: NEGATIVE
KETONES UR: NEGATIVE mg/dL
Nitrite: NEGATIVE
PROTEIN: NEGATIVE mg/dL
Specific Gravity, Urine: 1.02 (ref 1.005–1.030)
pH: 7 (ref 5.0–8.0)

## 2017-12-06 LAB — WET PREP, GENITAL
Sperm: NONE SEEN
TRICH WET PREP: NONE SEEN

## 2017-12-06 MED ORDER — METRONIDAZOLE 500 MG PO TABS: 500.0000 mg | ORAL_TABLET | Freq: Two times a day (BID) | ORAL | 0 refills | Status: DC

## 2017-12-06 MED ORDER — FLUCONAZOLE 150 MG PO TABS: 150.0000 mg | ORAL_TABLET | Freq: Every day | ORAL | 0 refills | Status: DC

## 2017-12-06 NOTE — ED Provider Notes (Signed)
[facility] ____________________________________________  Time seen: Approximately 4:21 PM  I have reviewed the triage vital signs and the nursing notes.   HISTORY  Chief Complaint Exposure to STD (APPT)  HPI Stephanie Faulkner is a 32 y.o. female presenting for evaluation of 4 days of vaginal discharge.  States vaginal discharge first was white and thick but then started to have a greenish color prompting her to come in.  Patient reports she is remains sexually active with her own and off again partner and further requests certain STD testing.  Denies any other new partners.  Denies any vaginal pain, vaginal lesions, abdominal pain, back pain, fevers.  States some urinary frequency but no burning with urination or other urinary changes.  Continues to eat and drink well.  Reports otherwise doing well denies other complaints.  Denies alleviating measures.  Denies other aggravating factors.  Denies current pregnancy.  No pain at this time.  Reports otherwise doing well.  Arlyss QueenSelvidge, William M, MD: PCP   Past Medical History:  Diagnosis Date  . Abortion   . Anxiety   . Depression   . History of D&C     Patient Active Problem List   Diagnosis Date Noted  . Pelvic pain 07/11/2017  . 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 CERVICAL CONE BIOPSY       No current facility-administered medications for this encounter.   Current Outpatient Medications:  .  sertraline (ZOLOFT) 100 MG tablet, Take 100 mg by mouth daily., Disp: , Rfl:   Allergies Patient has no known allergies.  Family History  Problem Relation Age of Onset  . Diabetes Mother   . Hypertension Mother   . Healthy Father     Social History Social History   Tobacco Use  . Smoking status: Never Smoker  . Smokeless tobacco: Never Used  Substance Use Topics  . Alcohol use: No  . Drug use: Not Currently    Types: Marijuana    Comment: occasionally       Review of Systems Constitutional: No fever Cardiovascular: Denies chest pain. Respiratory: Denies shortness of breath. Gastrointestinal: No abdominal pain.   Genitourinary: as above. Musculoskeletal: Negative for back pain. Skin: Negative for rash.  ____________________________________________   PHYSICAL EXAM:  VITAL SIGNS: ED Triage Vitals  Enc Vitals Group     BP 12/06/17 1534 (!) 126/91     Pulse Rate 12/06/17 1534 88     Resp 12/06/17 1534 16     Temp 12/06/17 1534 98.6 F (37 C)     Temp Source 12/06/17 1534 Oral     SpO2 12/06/17 1534 100 %     Weight 12/06/17 1535 143 lb (64.9 kg)     Height 12/06/17 1535 5\' 7"  (1.702 m)     Head Circumference --      Peak Flow --      Pain Score 12/06/17 1534 0     Pain Loc --      Pain Edu? --      Excl. in GC? --     Constitutional: Alert and oriented. Well appearing and in no acute distress. ENT      Head: Normocephalic and atraumatic. Cardiovascular: Normal rate, regular rhythm. Grossly normal heart sounds.  Good peripheral circulation. Respiratory: Normal respiratory effort without tachypnea nor retractions. Breath sounds are clear and equal bilaterally. No wheezes, rales, rhonchi. Gastrointestinal: Soft and nontender.  No CVA tenderness. Musculoskeletal: No midline cervical, thoracic or  lumbar tenderness to palpation.  Neurologic:  Normal speech and language.Speech is normal. No gait instability.  Skin:  Skin is warm, dry and intact. No rash noted. Psychiatric: Mood and affect are normal. Speech and behavior are normal. Patient exhibits appropriate insight and judgment   ___________________________________________   LABS (all labs ordered are listed, but only abnormal results are displayed)  Labs Reviewed  WET PREP, GENITAL - Abnormal; Notable for the following components:      Result Value   Yeast Wet Prep HPF POC PRESENT (*)    Clue Cells Wet Prep HPF POC PRESENT (*)    WBC, Wet Prep HPF POC FEW (*)    All  other components within normal limits  URINALYSIS, COMPLETE (UACMP) WITH MICROSCOPIC - Abnormal; Notable for the following components:   Leukocytes, UA LARGE (*)    Bacteria, UA FEW (*)    All other components within normal limits  CHLAMYDIA/NGC RT PCR ([facility] ONLY)  URINE CULTURE  HIV ANTIBODY (ROUTINE TESTING W REFLEX)  HEPATITIS PANEL, ACUTE    PROCEDURES Procedures    INITIAL IMPRESSION / ASSESSMENT AND PLAN / ED COURSE  Pertinent labs & imaging results that were available during my care of the patient were reviewed by me and considered in my medical decision making (see chart for details).  Well-appearing patient.  No acute distress.  Patient declined pelvic exam and elected self wet prep and self GC chlamydia swabs.  Wet prep positive for clue cells and yeast.  Urinalysis reviewed, discussed not clear UTI and await urine culture.  Will treat with oral Flagyl and Diflucan.  Patient further request HIV and hepatitis labs, declines other STD testing.  Discussed pelvic rest, supportive care and follow-up.Discussed indication, risks and benefits of medications with patient.  Discussed follow up with Primary care physician this week. Discussed follow up and return parameters including no resolution or any worsening concerns. Patient verbalized understanding and agreed to plan.   ____________________________________________   FINAL CLINICAL IMPRESSION(S) / ED DIAGNOSES  Final diagnoses:  Bacterial vaginosis  Yeast vaginitis  Dysuria     ED Discharge Orders    None       Note: This dictation was prepared with Dragon dictation along with smaller phrase technology. Any transcriptional errors that result from this process are unintentional.         Renford Dills, NP 12/06/17 1700

## 2017-12-06 NOTE — ED Triage Notes (Addendum)
Patient in today stating that she thought she had a yeast infection, but now has greenish discharge and a cottage cheese discharg and is concerned about STDs. Patient does states she has had urinary frequency, but denies dysuria.

## 2017-12-06 NOTE — Discharge Instructions (Signed)
Take medication as prescribed. Rest. Drink plenty of fluids. No sexual activity for at least one week.  ° °Follow up with your primary care physician this week as needed. Return to Urgent care for new or worsening concerns.  ° °

## 2017-12-07 ENCOUNTER — Telehealth: Payer: Self-pay | Admitting: Emergency Medicine

## 2017-12-07 LAB — HEPATITIS PANEL, ACUTE
HEP A IGM: NEGATIVE
HEP B S AG: NEGATIVE
Hep B C IgM: NEGATIVE

## 2017-12-07 LAB — HIV ANTIBODY (ROUTINE TESTING W REFLEX): HIV SCREEN 4TH GENERATION: NONREACTIVE

## 2017-12-07 NOTE — Telephone Encounter (Signed)
Tried calling patient to give her lab results.  No answer.  Left message for patient to call us back.

## 2017-12-08 LAB — URINE CULTURE: Culture: 10000 — AB

## 2018-01-14 ENCOUNTER — Telehealth: Payer: Self-pay | Admitting: Emergency Medicine

## 2018-01-14 NOTE — Telephone Encounter (Signed)
Patient called regarding her my chart results from a visit here in December.  Patient stated her My Chart results showed a UTI and she did not receive a follow up call and was not treated.  This nurse reviewed the results with the patient and provided clarity that the results actually indicated a possible contamination but not infection. Patient verbalized understanding and thanked for the information. NMW

## 2018-02-24 ENCOUNTER — Ambulatory Visit
Admission: EM | Admit: 2018-02-24 | Discharge: 2018-02-24 | Disposition: A | Discharge status: Home / Self Care | Payer: BLUE CROSS/BLUE SHIELD | Attending: Family Medicine | Admitting: Family Medicine

## 2018-02-24 ENCOUNTER — Encounter: Payer: Self-pay | Admitting: Emergency Medicine

## 2018-02-24 ENCOUNTER — Other Ambulatory Visit: Payer: Self-pay

## 2018-02-24 DIAGNOSIS — B373 Candidiasis of vulva and vagina: Secondary | ICD-10-CM | POA: Insufficient documentation

## 2018-02-24 DIAGNOSIS — B9689 Other specified bacterial agents as the cause of diseases classified elsewhere: Secondary | ICD-10-CM | POA: Diagnosis present

## 2018-02-24 DIAGNOSIS — B3731 Acute candidiasis of vulva and vagina: Secondary | ICD-10-CM

## 2018-02-24 DIAGNOSIS — N760 Acute vaginitis: Secondary | ICD-10-CM | POA: Diagnosis present

## 2018-02-24 LAB — WET PREP, GENITAL
SPERM: NONE SEEN
Trich, Wet Prep: NONE SEEN

## 2018-02-24 LAB — CHLAMYDIA/NGC RT PCR (ARMC ONLY)
CHLAMYDIA TR: NOT DETECTED
N GONORRHOEAE: NOT DETECTED

## 2018-02-24 MED ORDER — METRONIDAZOLE 500 MG PO TABS: 500.0000 mg | ORAL_TABLET | Freq: Two times a day (BID) | ORAL | 0 refills | Status: DC

## 2018-02-24 MED ORDER — FLUCONAZOLE 150 MG PO TABS: 150.0000 mg | ORAL_TABLET | Freq: Every day | ORAL | 0 refills | Status: DC

## 2018-02-24 NOTE — ED Provider Notes (Signed)
[facility] ____________________________________________  Time seen: Approximately 10:17 AM  I have reviewed the triage vital signs and the nursing notes.   HISTORY  Chief Complaint Vaginal Discharge   HPI Stephanie Faulkner is a 33 y.o. female present for evaluation of approximately 1 week of whitish vaginal discharge.  States occasional slight odor.  Noncolored discharge.  Denies any vaginal sores, pain or itching.  Denies accompanying abdominal pain, back pain, chest pain, shortness of breath, fevers.  Continues eat and drink well.  Sexually active with the same partner.  Patient does request additional testing of gonorrhea and chlamydia, stating only wants gonorrhea chlamydia STD testing.  Denies pregnancy.  Denies aggravating or alleviating factors.  Denies known trigger.  Does state she has history of BV this feels similar.  Does state that she intermittently douches.  Reports otherwise doing well.  Arlyss Queen, MD: PCP    Past Medical History:  Diagnosis Date  . Abortion   . Anxiety   . Depression   . History of D&C     Patient Active Problem List   Diagnosis Date Noted  . Pelvic pain 07/11/2017  . 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 CERVICAL CONE BIOPSY       No current facility-administered medications for this encounter.   Current Outpatient Medications:  .  sertraline (ZOLOFT) 100 MG tablet, Take 100 mg by mouth daily., Disp: , Rfl:  .  fluconazole (DIFLUCAN) 150 MG tablet, Take 1 tablet (150 mg total) by mouth daily. Take one pill orally, then Repeat in one week, Disp: 2 tablet, Rfl: 0 .  metroNIDAZOLE (FLAGYL) 500 MG tablet, Take 1 tablet (500 mg total) by mouth 2 (two) times daily., Disp: 14 tablet, Rfl: 0  Allergies Patient has no known allergies.  Family History  Problem Relation Age of Onset  . Diabetes Mother   . Hypertension Mother   . Healthy Father     Social  History Social History   Tobacco Use  . Smoking status: Never Smoker  . Smokeless tobacco: Never Used  Substance Use Topics  . Alcohol use: Yes    Comment: social  . Drug use: Not Currently    Types: Marijuana    Comment: occasionally     Review of Systems Constitutional: No fever Cardiovascular: Denies chest pain. Respiratory: Denies shortness of breath. Gastrointestinal: No abdominal pain.  No nausea, no vomiting.  No diarrhea.  No constipation. Genitourinary: Negative for dysuria. Musculoskeletal: Negative for back pain. Skin: Negative for rash.  ____________________________________________   PHYSICAL EXAM:  VITAL SIGNS: ED Triage Vitals  Enc Vitals Group     BP 02/24/18 0846 120/81     Pulse Rate 02/24/18 0846 65     Resp 02/24/18 0846 18     Temp 02/24/18 0846 98.4 F (36.9 C)     Temp Source 02/24/18 0846 Oral     SpO2 02/24/18 0846 100 %     Weight 02/24/18 0844 147 lb (66.7 kg)     Height 02/24/18 0844 5\' 7"  (1.702 m)     Head Circumference --      Peak Flow --      Pain Score 02/24/18 0844 0     Pain Loc --      Pain Edu? --      Excl. in GC? --     Constitutional: Alert and oriented. Well appearing and in no acute distress. ENT  Head: Normocephalic and atraumatic. Cardiovascular: Normal rate, regular rhythm. Grossly normal heart sounds.  Good peripheral circulation. Respiratory: Normal respiratory effort without tachypnea nor retractions. Breath sounds are clear and equal bilaterally. No wheezes, rales, rhonchi. Gastrointestinal: Soft and nontender.No CVA tenderness. Musculoskeletal:No midline cervical, thoracic or lumbar tenderness to palpation. Neurologic:  Normal speech and language. Speech is normal. No gait instability.  Skin:  Skin is warm, dry and intact. No rash noted. Psychiatric: Mood and affect are normal. Speech and behavior are normal. Patient exhibits appropriate insight and judgment   ___________________________________________    LABS (all labs ordered are listed, but only abnormal results are displayed)  Labs Reviewed  WET PREP, GENITAL - Abnormal; Notable for the following components:      Result Value   Yeast Wet Prep HPF POC PRESENT (*)    Clue Cells Wet Prep HPF POC PRESENT (*)    WBC, Wet Prep HPF POC MANY (*)    All other components within normal limits  CHLAMYDIA/NGC RT PCR ([facility] ONLY)    PROCEDURES Procedures    INITIAL IMPRESSION / ASSESSMENT AND PLAN / ED COURSE  Pertinent labs & imaging results that were available during my care of the patient were reviewed by me and considered in my medical decision making (see chart for details).  Well-appearing patient.  No acute distress.  Patient elected for self vaginal swabs.  Declines pregnancy.  Wet prep positive for clue cells and yeast.  Discussed treatment options with patient will treat with Flagyl and Diflucan.  Discussed pelvic rest.  Patient does report she has had BV several times, counseled regarding douching and risks, and encouraged to follow-up with gynecology, information given.Discussed indication, risks and benefits of medications with patient.  Discussed follow up with Primary care physician this week. Discussed follow up and return parameters including no resolution or any worsening concerns. Patient verbalized understanding and agreed to plan.   ____________________________________________   FINAL CLINICAL IMPRESSION(S) / ED DIAGNOSES  Final diagnoses:  BV (bacterial vaginosis)  Yeast vaginitis     ED Discharge Orders         Ordered    metroNIDAZOLE (FLAGYL) 500 MG tablet  2 times daily     02/24/18 0933    fluconazole (DIFLUCAN) 150 MG tablet  Daily     02/24/18 0933           Note: This dictation was prepared with Dragon dictation along with smaller phrase technology. Any transcriptional errors that result from this process are unintentional.         Renford Dills, NP 02/24/18 1019

## 2018-02-24 NOTE — ED Triage Notes (Signed)
Patient c/o vaginal discharge x 2 weeks. Patient denies itching, denies dysuria. She states she gets yeast infections and BV frequently.

## 2018-02-24 NOTE — Discharge Instructions (Addendum)
Take medication as prescribed. Rest. Drink plenty of fluids.  ° °Follow up with your primary care physician this week as needed. Return to Urgent care for new or worsening concerns.  ° °

## 2018-02-28 ENCOUNTER — Ambulatory Visit
Admission: EM | Admit: 2018-02-28 | Discharge: 2018-02-28 | Discharge status: Against Medical Advice | Payer: Medicaid Other

## 2018-05-03 ENCOUNTER — Ambulatory Visit
Admission: EM | Admit: 2018-05-03 | Discharge: 2018-05-03 | Disposition: A | Discharge status: Home / Self Care | Payer: Medicaid Other | Attending: Family Medicine | Admitting: Family Medicine

## 2018-05-03 ENCOUNTER — Other Ambulatory Visit: Payer: Self-pay

## 2018-05-03 DIAGNOSIS — J029 Acute pharyngitis, unspecified: Secondary | ICD-10-CM | POA: Diagnosis present

## 2018-05-03 LAB — RAPID STREP SCREEN (MED CTR MEBANE ONLY): Streptococcus, Group A Screen (Direct): NEGATIVE

## 2018-05-03 NOTE — ED Triage Notes (Signed)
Pt here for sore throat for a day and a half. Also noted she seen white spots on the back of her tongue. No otc meds tried.

## 2018-05-03 NOTE — ED Provider Notes (Signed)
[facility]    CSN: [id number] Arrival date & time: 05/03/18  1419     History   Chief Complaint Chief Complaint  Patient presents with  . Sore Throat    HPI Stephanie Faulkner is a [age] y.o. female.   The history is provided by the patient.  Sore Throat  This is a new problem. The current episode started 2 days ago. The problem occurs constantly. The problem has not changed since onset.Pertinent negatives include no chest pain, no abdominal pain, no headaches and no shortness of breath. She has tried nothing for the symptoms.    Past Medical History:  Diagnosis Date  . Abortion   . Anxiety   . Depression   . History of D&C     Patient Active Problem List   Diagnosis Date Noted  . Pelvic pain 07/11/2017  . 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 CERVICAL CONE BIOPSY      OB History    Gravida  1   Para      Term      Preterm      AB  1   Living        SAB      TAB      Ectopic      Multiple      Live Births               Home Medications    Prior to Admission medications   Medication Sig Start Date End Date Taking? Authorizing Provider  sertraline (ZOLOFT) 100 MG tablet Take 100 mg by mouth daily.   Yes [provider]  fluconazole (DIFLUCAN) 150 MG tablet Take 1 tablet (150 mg total) by mouth daily. Take one pill orally, then Repeat in one week 02/24/18   Renford DillsMiller, Lindsey, NP  metroNIDAZOLE (FLAGYL) 500 MG tablet Take 1 tablet (500 mg total) by mouth 2 (two) times daily. 02/24/18   Renford DillsMiller, Lindsey, NP    Family History Family History  Problem Relation Age of Onset  . Diabetes Mother   . Hypertension Mother   . Healthy Father     Social History Social History   Tobacco Use  . Smoking status: Never Smoker  . Smokeless tobacco: Never Used  Substance Use Topics  . Alcohol use: Yes    Comment: social  . Drug use: Not Currently    Types: Marijuana    Comment:  occasionally      Allergies   Patient has no known allergies.   Review of Systems Review of Systems  Respiratory: Negative for shortness of breath.   Cardiovascular: Negative for chest pain.  Gastrointestinal: Negative for abdominal pain.  Neurological: Negative for headaches.     Physical Exam Triage Vital Signs ED Triage Vitals  Enc Vitals Group     BP 05/03/18 1439 113/72     Pulse Rate 05/03/18 1439 80     Resp 05/03/18 1439 18     Temp 05/03/18 1439 98.6 F (37 C)     Temp Source 05/03/18 1439 Oral     SpO2 05/03/18 1439 100 %     Weight 05/03/18 1440 145 lb (65.8 kg)     Height 05/03/18 1440 5\' 7"  (1.702 m)     Head Circumference --      Peak Flow --      Pain Score 05/03/18 1440 2     Pain Loc --  Pain Edu? --      Excl. in GC? --    No data found.  Updated Vital Signs BP 113/72 (BP Location: Left Arm)   Pulse 80   Temp 98.6 F (37 C) (Oral)   Resp 18   Ht 5\' 7"  (1.702 m)   Wt 65.8 kg   LMP 04/21/2018 (Exact Date)   SpO2 100%   Breastfeeding No   BMI 22.71 kg/m   Visual Acuity Right Eye Distance:   Left Eye Distance:   Bilateral Distance:    Right Eye Near:   Left Eye Near:    Bilateral Near:     Physical Exam Vitals signs and nursing note reviewed.  Constitutional:      General: She is not in acute distress.    Appearance: She is not toxic-appearing or diaphoretic.  HENT:     Mouth/Throat:     Palate: Lesions (few white lesions on tonsils) present.     Pharynx: Posterior oropharyngeal erythema present.  Neurological:     Mental Status: She is alert.      UC Treatments / Results  Labs (all labs ordered are listed, but only abnormal results are displayed) Labs Reviewed  RAPID STREP SCREEN ([facility] ONLY)  CULTURE, GROUP A STREP [facility])    EKG None  Radiology No results found.  Procedures Procedures (including critical care time)  Medications Ordered in UC Medications - No data to display  Initial  Impression / Assessment and Plan / UC Course  I have reviewed the triage vital signs and the nursing notes.  Pertinent labs & imaging results that were available during my care of the patient were reviewed by me and considered in my medical decision making (see chart for details).      Final Clinical Impressions(s) / UC Diagnoses   Final diagnoses:  Viral pharyngitis     Discharge Instructions     Salt water gargles, mouthwash gargles, tylenol/advil, fluids    ED Prescriptions    None     1. Lab results and diagnosis reviewed with patient 2. Recommend supportive treatment as above 3. Follow-up prn if symptoms worsen or don't improve  Controlled Substance Prescriptions [place] Controlled Substance Registry consulted? Not Applicable   Payton Mccallum, MD 05/03/18 [phone]

## 2018-05-03 NOTE — Discharge Instructions (Signed)
Salt water gargles, mouthwash gargles, tylenol/advil, fluids

## 2018-05-06 LAB — CULTURE, GROUP A STREP (THRC)

## 2018-05-23 ENCOUNTER — Encounter: Payer: Self-pay | Admitting: Emergency Medicine

## 2018-05-23 ENCOUNTER — Other Ambulatory Visit: Payer: Self-pay

## 2018-05-23 ENCOUNTER — Emergency Department
Admission: EM | Admit: 2018-05-23 | Discharge: 2018-05-23 | Disposition: A | Discharge status: Home / Self Care | Payer: Medicaid Other | Attending: Emergency Medicine | Admitting: Emergency Medicine

## 2018-05-23 DIAGNOSIS — N760 Acute vaginitis: Secondary | ICD-10-CM | POA: Insufficient documentation

## 2018-05-23 DIAGNOSIS — B9689 Other specified bacterial agents as the cause of diseases classified elsewhere: Secondary | ICD-10-CM

## 2018-05-23 DIAGNOSIS — Z79899 Other long term (current) drug therapy: Secondary | ICD-10-CM | POA: Diagnosis not present

## 2018-05-23 MED ORDER — METRONIDAZOLE 500 MG PO TABS: 500.0000 mg | ORAL_TABLET | Freq: Three times a day (TID) | ORAL | 0 refills | Status: DC

## 2018-05-23 NOTE — ED Provider Notes (Signed)
[facility] Emergency Department Provider Note  ____________________________________________   First MD Initiated Contact with Patient 05/23/18 [id number]     (approximate)  I have reviewed the triage vital signs and the nursing notes.   HISTORY  Chief Complaint Vaginal Discharge    HPI Stephanie Faulkner is a 33 y.o. female presents the emergency department with vaginal discharge and history of BV.  She states she had a STD screening done 5 days ago which was negative.  She states she gets BV frequently after her period.  She states she notes that this is what the infection has.  She denies any fever or chills.  No sexual contact since her recent testing 5 days ago.    Past Medical History:  Diagnosis Date  . Abortion   . Anxiety   . Depression   . History of D&C     Patient Active Problem List   Diagnosis Date Noted  . Pelvic pain 07/11/2017  . 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 CERVICAL CONE BIOPSY      Prior to Admission medications   Medication Sig Start Date End Date Taking? Authorizing Provider  metroNIDAZOLE (FLAGYL) 500 MG tablet Take 1 tablet (500 mg total) by mouth 3 (three) times daily. 05/23/18   Fisher, Roselyn BeringSusan W, PA-C  sertraline (ZOLOFT) 100 MG tablet Take 100 mg by mouth daily.    [provider]    Allergies Patient has no known allergies.  Family History  Problem Relation Age of Onset  . Diabetes Mother   . Hypertension Mother   . Healthy Father     Social History Social History   Tobacco Use  . Smoking status: Never Smoker  . Smokeless tobacco: Never Used  Substance Use Topics  . Alcohol use: Yes    Comment: social  . Drug use: Not Currently    Types: Marijuana    Comment: occasionally     Review of Systems  Constitutional: No fever/chills Eyes: No visual changes. ENT: No sore throat. Respiratory: Denies cough Genitourinary: Negative for  dysuria.  Complaints of BV Musculoskeletal: Negative for back pain. Skin: Negative for rash.    ____________________________________________   PHYSICAL EXAM:  VITAL SIGNS: ED Triage Vitals  Enc Vitals Group     BP 05/23/18 0711 107/76     Pulse Rate 05/23/18 0711 68     Resp 05/23/18 0711 16     Temp 05/23/18 0711 98.2 F (36.8 C)     Temp Source 05/23/18 0711 Oral     SpO2 05/23/18 0711 100 %     Weight 05/23/18 0708 145 lb 1 oz (65.8 kg)     Height --      Head Circumference --      Peak Flow --      Pain Score 05/23/18 0708 0     Pain Loc --      Pain Edu? --      Excl. in GC? --     Constitutional: Alert and oriented. Well appearing and in no acute distress. Eyes: Conjunctivae are normal.  Head: Atraumatic. Nose: No congestion/rhinnorhea. Mouth/Throat: Mucous membranes are moist.   Neck:  supple no lymphadenopathy noted Cardiovascular: Normal rate, regular rhythm. Respiratory: Normal respiratory effort.  No retractions GU: deferred Musculoskeletal: FROM all extremities, warm and well perfused Neurologic:  Normal speech and language.  Skin:  Skin is warm, dry and intact. No rash noted. Psychiatric: Mood and affect are  normal. Speech and behavior are normal.  ____________________________________________   LABS (all labs ordered are listed, but only abnormal results are displayed)  Labs Reviewed - No data to display ____________________________________________   ____________________________________________  RADIOLOGY    ____________________________________________   PROCEDURES  Procedure(s) performed: No  Procedures    ____________________________________________   INITIAL IMPRESSION / ASSESSMENT AND PLAN / ED COURSE  Pertinent labs & imaging results that were available during my care of the patient were reviewed by me and considered in my medical decision making (see chart for details).   Patient is 33 year old female with long history  of BV.  She states she knows that this odor and the discharge are the same.  She had a STD screening 5 days ago at [facility].  No additional sexual contact since 5 days ago.  She states she frequently gets bacterial vaginosis following her menstrual cycle.  She denies any fever or chills.  Physical exam is benign.  Patient is deferring the pelvic exam due to her recent STD screening 5 days ago.  Discussed her options.  But due to her long history of bacterial vaginosis noted in her past medical charts along with her negative STD screening 5 days ago I will issue her a prescription for Flagyl 500 mg.  She is to follow-up with [facility] if not improving.  Return emergency department worsening.  She was discharged stable condition.     As part of my medical decision making, I reviewed the following data within the electronic MEDICAL RECORD NUMBER Nursing notes reviewed and incorporated, Old chart reviewed, Notes from prior ED visits and [place] Controlled Substance Database  ____________________________________________   FINAL CLINICAL IMPRESSION(S) / ED DIAGNOSES  Final diagnoses:  BV (bacterial vaginosis)      NEW MEDICATIONS STARTED DURING THIS VISIT:  New Prescriptions   METRONIDAZOLE (FLAGYL) 500 MG TABLET    Take 1 tablet (500 mg total) by mouth 3 (three) times daily.     Note:  This document was prepared using Dragon voice recognition software and may include unintentional dictation errors.    [name], PA-C 05/23/18 0759    [name], MD 05/23/18 [phone]

## 2018-05-23 NOTE — ED Notes (Signed)
See triage note  States she developed vaginal discharge with a foul smell   States she has hx of BV

## 2018-05-23 NOTE — Discharge Instructions (Addendum)
Follow up with your regular doctor if not better in 3 days

## 2018-05-23 NOTE — ED Triage Notes (Signed)
States "I am here to be treated for BV"

## 2018-06-13 ENCOUNTER — Other Ambulatory Visit: Payer: Self-pay

## 2018-06-13 ENCOUNTER — Encounter: Payer: Self-pay | Admitting: Emergency Medicine

## 2018-06-13 ENCOUNTER — Emergency Department
Admission: EM | Admit: 2018-06-13 | Discharge: 2018-06-13 | Disposition: A | Discharge status: Home / Self Care | Payer: Medicaid Other | Attending: Emergency Medicine | Admitting: Emergency Medicine

## 2018-06-13 DIAGNOSIS — N898 Other specified noninflammatory disorders of vagina: Secondary | ICD-10-CM | POA: Diagnosis not present

## 2018-06-13 DIAGNOSIS — Z202 Contact with and (suspected) exposure to infections with a predominantly sexual mode of transmission: Secondary | ICD-10-CM | POA: Diagnosis not present

## 2018-06-13 MED ORDER — AZITHROMYCIN 500 MG PO TABS
1000.0000 mg | ORAL_TABLET | Freq: Once | ORAL | Status: AC
  Administered 2018-06-13: 10:00:00 1000 mg via ORAL
  Filled 2018-06-13: qty 2

## 2018-06-13 MED ORDER — AZITHROMYCIN 500 MG PO TABS
ORAL_TABLET | ORAL | Status: AC
  Filled 2018-06-13: qty 1

## 2018-06-13 MED ORDER — CEFTRIAXONE SODIUM 250 MG IJ SOLR
250.0000 mg | Freq: Once | INTRAMUSCULAR | Status: AC
  Administered 2018-06-13: 250 mg via INTRAMUSCULAR
  Filled 2018-06-13: qty 250

## 2018-06-13 NOTE — ED Triage Notes (Signed)
Patient states her partner tested positive for gonorrhea.  Patient denies dysuria but reports some vaginal irritation.  Patient states, "I just want to go ahead and get treated but I couldn't get an appointment with my regular doctor."

## 2018-06-13 NOTE — ED Notes (Signed)
See triage note  Presents with possible STD  States her sexual partner tested positive for GC   She denies any sxs' but is currently on her period

## 2018-06-13 NOTE — ED Provider Notes (Signed)
[facility] Emergency Department Provider Note   ____________________________________________   First MD Initiated Contact with Patient 06/13/18 [phone]     (approximate)  I have reviewed the triage vital signs and the nursing notes.   HISTORY  Chief Complaint Exposure to STD    HPI Stephanie Faulkner is a 33 y.o. female patient presents to the ED with complaint of STD exposure.  Patient states she was notified by her significant other that he was diagnosed and treated for gonorrhea.  Patient state last sexual contact with sexual partner was 4 days ago.  Patient menstrual cycle started 2 days ago.  Patient states she is having some mild vaginal irritation but other complaints are consistent with her normal menstrual cycle symptoms.     Past Medical History:  Diagnosis Date  . Abortion   . Anxiety   . Depression   . History of D&C     Patient Active Problem List   Diagnosis Date Noted  . Pelvic pain 07/11/2017  . 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 CERVICAL CONE BIOPSY      Prior to Admission medications   Medication Sig Start Date End Date Taking? Authorizing Provider  sertraline (ZOLOFT) 100 MG tablet Take 100 mg by mouth daily.    [provider]    Allergies Patient has no known allergies.  Family History  Problem Relation Age of Onset  . Diabetes Mother   . Hypertension Mother   . Healthy Father     Social History Social History   Tobacco Use  . Smoking status: Never Smoker  . Smokeless tobacco: Never Used  Substance Use Topics  . Alcohol use: Yes    Comment: social  . Drug use: Not Currently    Types: Marijuana    Comment: occasionally     Review of Systems Constitutional: No fever/chills Eyes: No visual changes. ENT: No sore throat. Cardiovascular: Denies chest pain. Respiratory: Denies shortness of breath. Gastrointestinal: No abdominal pain.  No  nausea, no vomiting.  No diarrhea.  No constipation. Genitourinary: Current menstrual cycle. Musculoskeletal: Negative for back pain. Skin: Negative for rash. Neurological: Negative for headaches, focal weakness or numbness. Psychiatric:  Anxiety and depression. ____________________________________________   PHYSICAL EXAM:  VITAL SIGNS: ED Triage Vitals  Enc Vitals Group     BP 06/13/18 0920 133/81     Pulse Rate 06/13/18 0920 70     Resp --      Temp 06/13/18 0920 97.9 F (36.6 C)     Temp Source 06/13/18 0920 Oral     SpO2 06/13/18 0920 100 %     Weight 06/13/18 0921 155 lb (70.3 kg)     Height 06/13/18 0921 5\' 7"  (1.702 m)     Head Circumference --      Peak Flow --      Pain Score 06/13/18 0924 0     Pain Loc --      Pain Edu? --      Excl. in [place]? --     Constitutional: Alert and oriented. Well appearing and in no acute distress. Cardiovascular: Normal rate, regular rhythm. Grossly normal heart sounds.  Good peripheral circulation. Respiratory: Normal respiratory effort.  No retractions. Lungs CTAB. Gastrointestinal: Soft and nontender. No distention. No abdominal bruits. No CVA tenderness. Genitourinary: Deferred. Neurologic:  Normal speech and language. No gross focal neurologic deficits are appreciated. No gait instability. Skin:  Skin is warm, dry and  intact. No rash noted. Psychiatric: Mood and affect are normal. Speech and behavior are normal.  ____________________________________________   LABS (all labs ordered are listed, but only abnormal results are displayed)  Labs Reviewed - No data to display ____________________________________________  EKG   ____________________________________________  RADIOLOGY  ED MD interpretation:    Official radiology report(s): No results found.  ____________________________________________   PROCEDURES  Procedure(s) performed (including Critical Care):  Procedures    ____________________________________________   INITIAL IMPRESSION / ASSESSMENT AND PLAN / ED COURSE  As part of my medical decision making, I reviewed the following data within the [place]         Patient presents the ED secondary to STD exposure.  Labs were deferred secondary to patient's menstrual cycle.  Patient will be treated prophylactically with Zithromax and Rocephin.  Patient advised to follow-up with the community health department.      ____________________________________________   FINAL CLINICAL IMPRESSION(S) / ED DIAGNOSES  Final diagnoses:  STD exposure     ED Discharge Orders    None       Note:  This document was prepared using Dragon voice recognition software and may include unintentional dictation errors.    Sable Feil, PA-C 06/13/18 0945    Earleen Newport, MD 06/13/18 1246

## 2018-06-28 ENCOUNTER — Other Ambulatory Visit: Payer: Self-pay

## 2018-06-28 ENCOUNTER — Emergency Department
Admission: EM | Admit: 2018-06-28 | Discharge: 2018-06-28 | Discharge status: Against Medical Advice | Payer: Medicaid Other

## 2018-07-03 IMAGING — US US PELVIS COMPLETE TRANSABD/TRANSVAG
1 series · 14 of 25 positions shown · non-contrast
Comparison: May 25, 2015

CLINICAL DATA: Recent abortion with cramping and pelvic pain.
Reported positive urine pregnancy test.

EXAM:
TRANSABDOMINAL AND TRANSVAGINAL ULTRASOUND OF PELVIS
TECHNIQUE: Study was performed transabdominally to optimize pelvic field of
view evaluation and transvaginally to optimize internal visceral
architecture evaluation.

[Series 1: us pelvis complete transabd/transvag · 0.17mm/px · 14 of 81 slices shown]
[im 1/81]
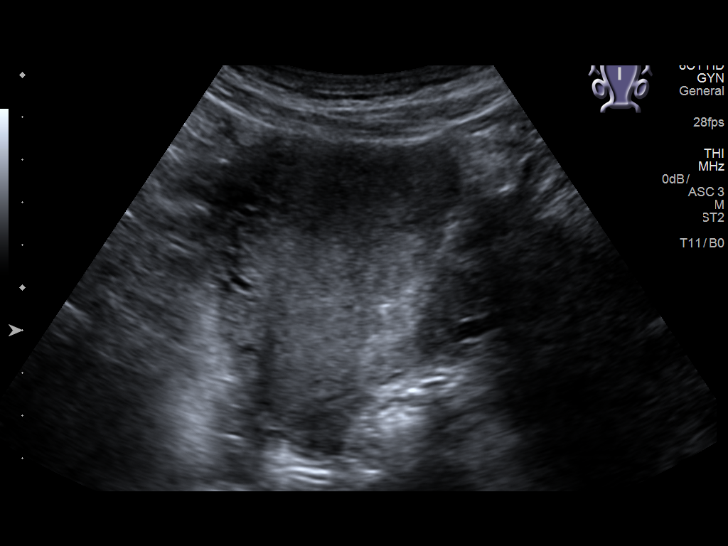
[im 7/81]
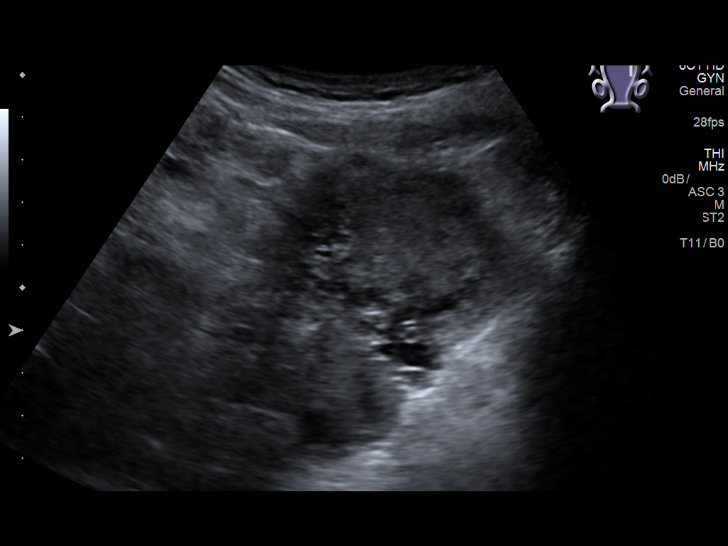
[im 14/81]
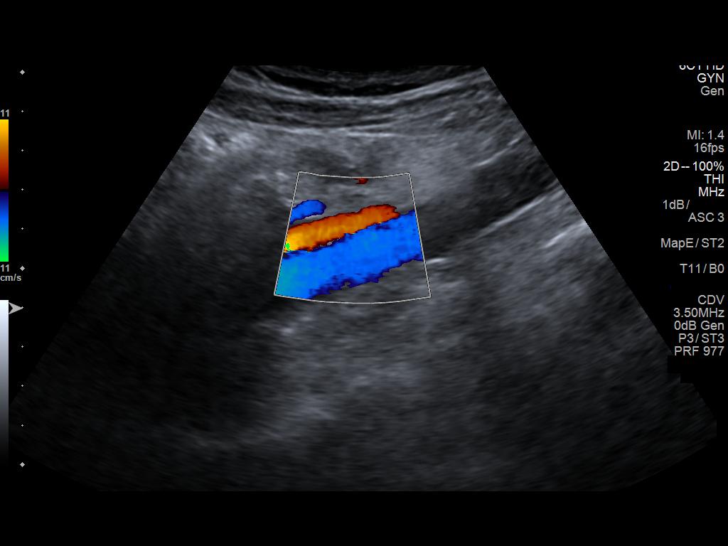
[im 21/81]
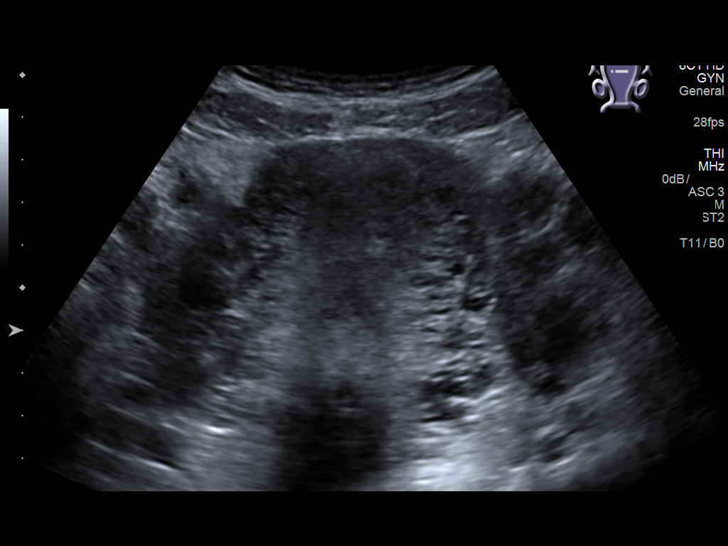
[im 27/81]
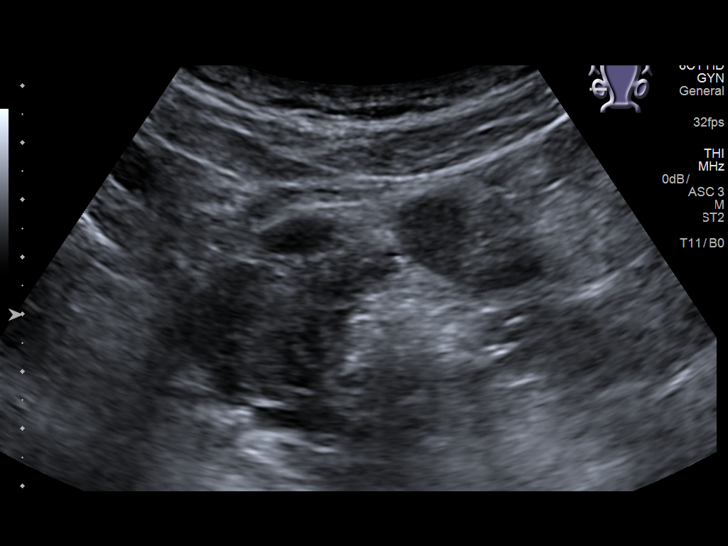
[im 31/81]
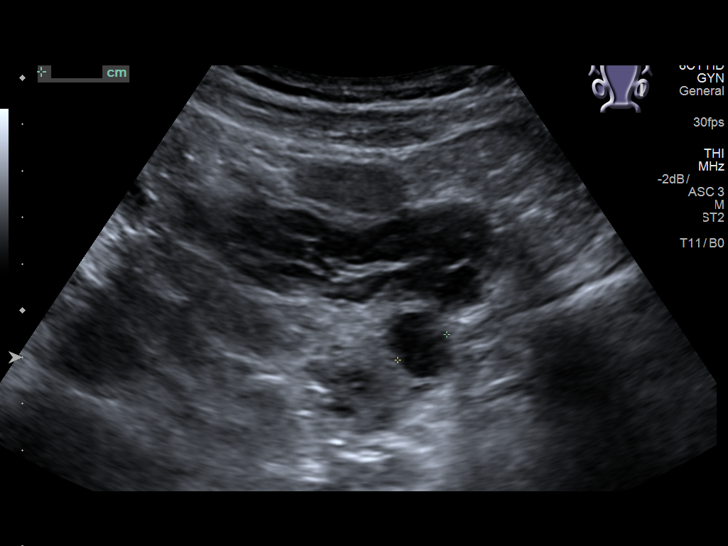
[im 37/81]
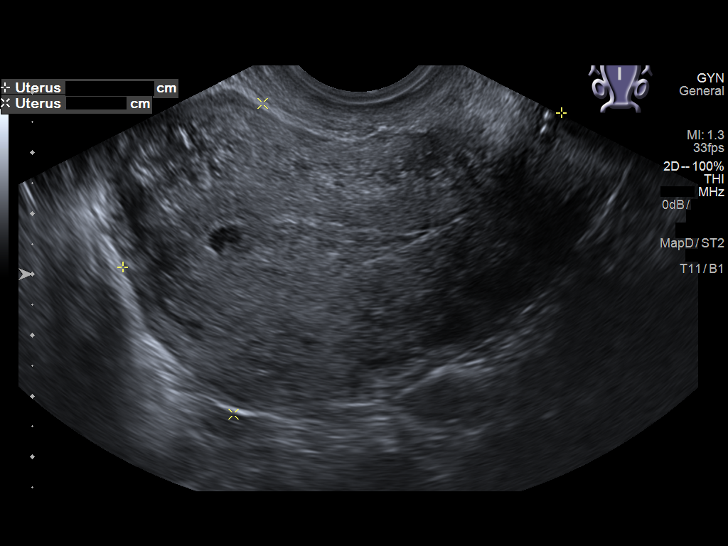
[im 44/81]
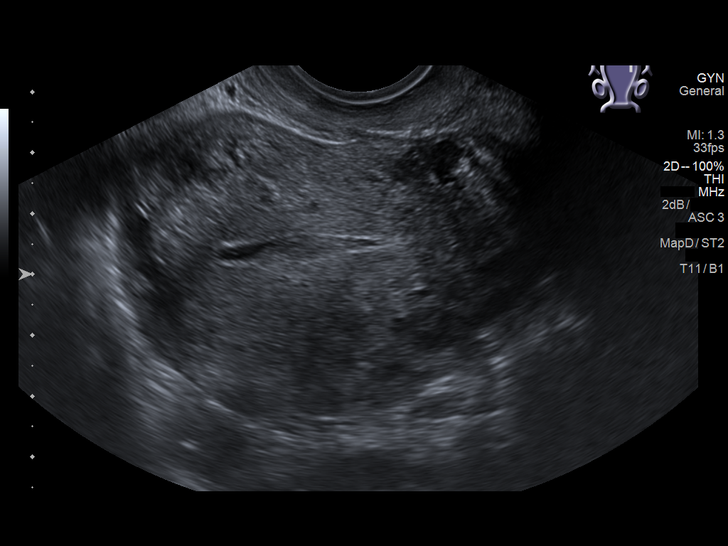
[im 51/81]
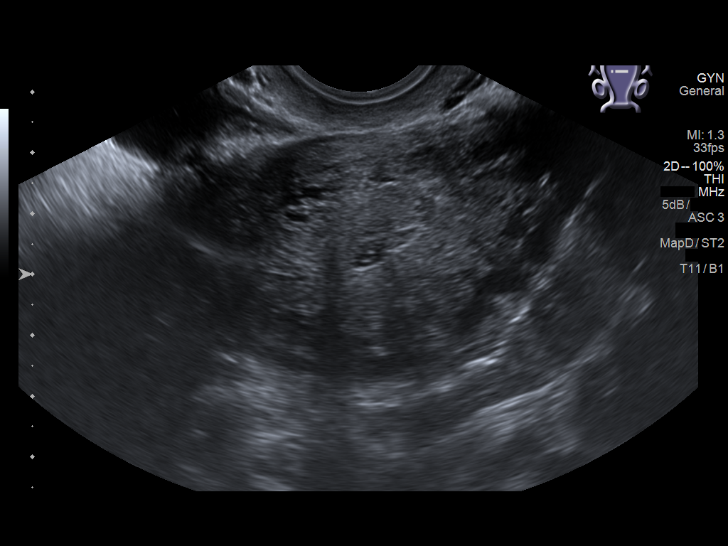
[im 54/81]
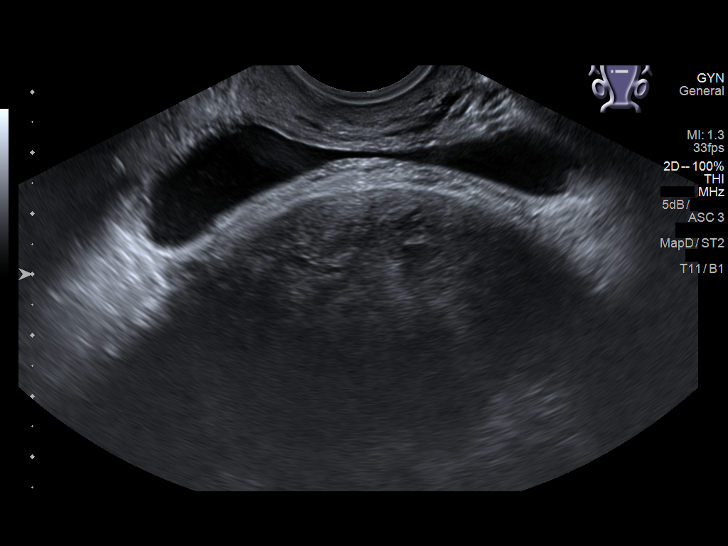
[im 61/81]
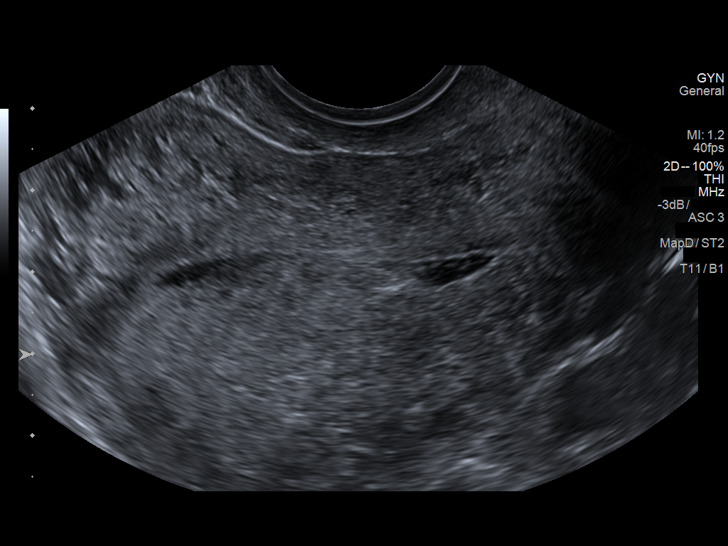
[im 67/81]
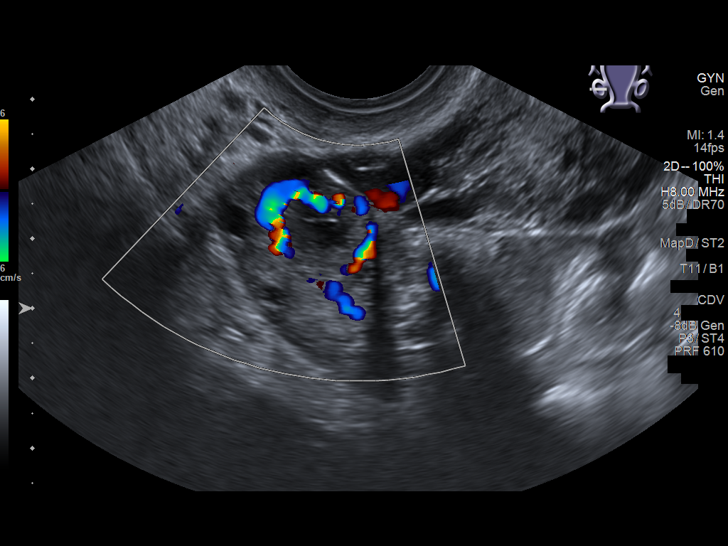
[im 74/81]
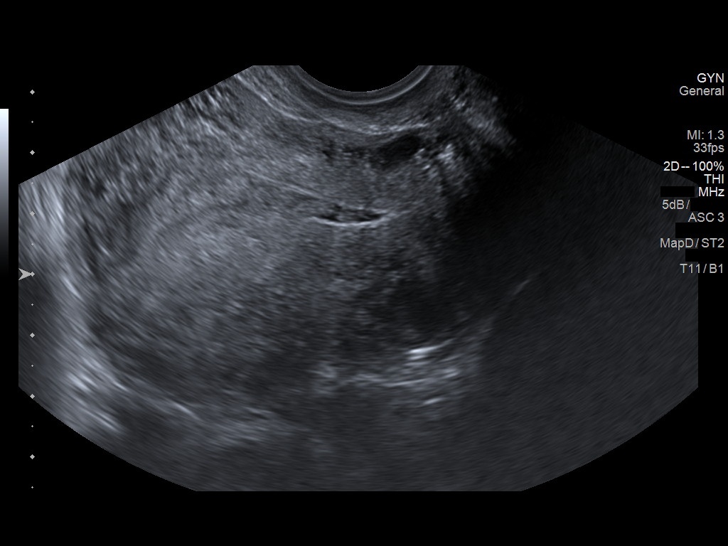
[im 81/81]
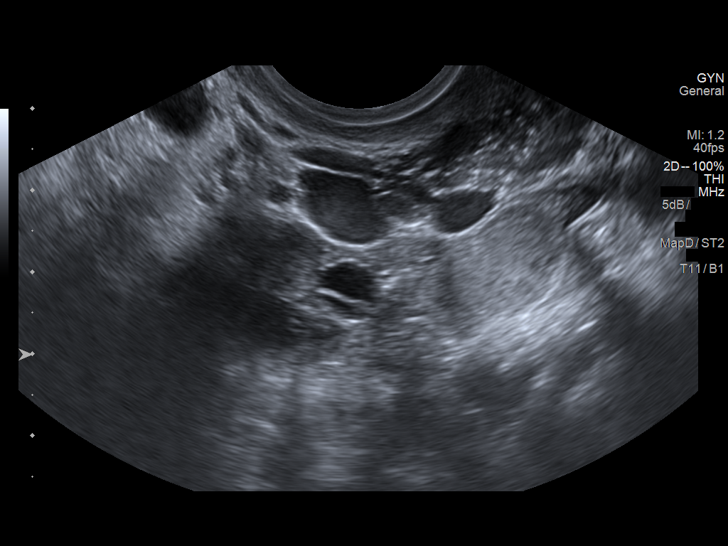

[14 of 25 positions shown; findings below may reference images not displayed]

FINDINGS: Uterus

Measurements: 7.3 x 5.8 x 6.1 cm. No fibroids or other mass
visualized.

Endometrium

Thickness: 6 mm.. There is fluid and ill-defined echogenic material
within the endometrium.

Right ovary

Measurements: 3.6 x 2.0 x 3.1 cm. Normal appearance/no adnexal mass.

Left ovary

Measurements: 3.7 x 2.3 x 3.1 cm. Normal appearance/no adnexal mass.
Multiple follicles are noted in the left ovary, largest measuring
1.3 cm in length.

Other findings

No abnormal free fluid.
IMPRESSION: Endometrium does not appear thickened. However, there is
inhomogeneous material within the endometrium as well as mild fluid.
Question thrombus secondary to recent abortion. Superimposed
retained products of conception cannot be entirely excluded in this
circumstance.

Study otherwise unremarkable.

## 2018-07-22 ENCOUNTER — Encounter: Payer: Self-pay | Admitting: Emergency Medicine

## 2018-07-22 ENCOUNTER — Other Ambulatory Visit: Payer: Self-pay

## 2018-07-22 ENCOUNTER — Emergency Department
Admission: EM | Admit: 2018-07-22 | Discharge: 2018-07-22 | Disposition: A | Discharge status: Home / Self Care | Payer: Medicaid Other | Attending: Emergency Medicine | Admitting: Emergency Medicine

## 2018-07-22 DIAGNOSIS — N899 Noninflammatory disorder of vagina, unspecified: Secondary | ICD-10-CM | POA: Diagnosis not present

## 2018-07-22 DIAGNOSIS — Z202 Contact with and (suspected) exposure to infections with a predominantly sexual mode of transmission: Secondary | ICD-10-CM | POA: Diagnosis not present

## 2018-07-22 DIAGNOSIS — Z113 Encounter for screening for infections with a predominantly sexual mode of transmission: Secondary | ICD-10-CM | POA: Diagnosis present

## 2018-07-22 DIAGNOSIS — N898 Other specified noninflammatory disorders of vagina: Secondary | ICD-10-CM

## 2018-07-22 LAB — URINALYSIS, COMPLETE (UACMP) WITH MICROSCOPIC
Bilirubin Urine: NEGATIVE
Glucose, UA: NEGATIVE mg/dL
Hgb urine dipstick: NEGATIVE
Ketones, ur: 20 mg/dL — AB
Leukocytes,Ua: NEGATIVE
Nitrite: NEGATIVE
Protein, ur: 30 mg/dL — AB
Specific Gravity, Urine: 1.03 (ref 1.005–1.030)
pH: 6 (ref 5.0–8.0)

## 2018-07-22 LAB — POCT PREGNANCY, URINE: Preg Test, Ur: NEGATIVE

## 2018-07-22 LAB — WET PREP, GENITAL
Clue Cells Wet Prep HPF POC: NONE SEEN
Sperm: NONE SEEN
Trich, Wet Prep: NONE SEEN
Yeast Wet Prep HPF POC: NONE SEEN

## 2018-07-22 LAB — CHLAMYDIA/NGC RT PCR (ARMC ONLY)
Chlamydia Tr: NOT DETECTED
N gonorrhoeae: NOT DETECTED

## 2018-07-22 NOTE — ED Triage Notes (Signed)
Says exposed to std.

## 2018-07-22 NOTE — ED Notes (Signed)
See triage note  States she has a slight vaginal discharge  Not sure if she has been exposed to STD

## 2018-07-22 NOTE — Discharge Instructions (Addendum)
Your exam does not show any signs of a serious infection or STI. You should follow-up with your provider for ongoing symptoms. You may call back in 2 hours for results of your pending test.

## 2018-07-22 NOTE — ED Provider Notes (Signed)
[facility] Emergency Department Provider Note ____________________________________________  Time seen: 1002  I have reviewed the triage vital signs and the nursing notes.  HISTORY  Chief Complaint  Exposure to STD  HPI Stephanie Faulkner is a 33 y.o. female presents herself to the ED for concern over possible STD exposure.  Patient describes a non-purulent, nonpruritic, vaginal discharge without any odor.  She reports being treated for previous STD exposure and took medications as prescribed.  She denies any fevers, chills, sweats patient also denies any abnormal vaginal bleeding, pelvic lesions, or pelvic pain.  Past Medical History:  Diagnosis Date  . Abortion   . Anxiety   . Depression   . History of D&C     Patient Active Problem List   Diagnosis Date Noted  . Pelvic pain 07/11/2017  . 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 CERVICAL CONE BIOPSY      Prior to Admission medications   Medication Sig Start Date End Date Taking? Authorizing Provider  sertraline (ZOLOFT) 100 MG tablet Take 100 mg by mouth daily.    [provider]    Allergies Patient has no known allergies.  Family History  Problem Relation Age of Onset  . Diabetes Mother   . Hypertension Mother   . Healthy Father     Social History Social History   Tobacco Use  . Smoking status: Never Smoker  . Smokeless tobacco: Never Used  Substance Use Topics  . Alcohol use: Yes    Comment: social  . Drug use: Not Currently    Types: Marijuana    Comment: occasionally     Review of Systems  Constitutional: Negative for fever. Eyes: Negative for visual changes. ENT: Negative for sore throat. Cardiovascular: Negative for chest pain. Respiratory: Negative for shortness of breath. Gastrointestinal: Negative for abdominal pain, vomiting and diarrhea. Genitourinary: Negative for dysuria.  Reports scant vaginal  discharge. Musculoskeletal: Negative for back pain. Skin: Negative for rash. Neurological: Negative for headaches, focal weakness or numbness. ____________________________________________  PHYSICAL EXAM:  VITAL SIGNS: ED Triage Vitals  Enc Vitals Group     BP 07/22/18 0932 (!) 137/99     Pulse Rate 07/22/18 0932 77     Resp 07/22/18 0932 17     Temp 07/22/18 0932 98.8 F (37.1 C)     Temp Source 07/22/18 0932 Oral     SpO2 07/22/18 0932 96 %     Weight 07/22/18 0953 185 lb (83.9 kg)     Height 07/22/18 0953 5\' 7"  (1.702 m)     Head Circumference --      Peak Flow --      Pain Score 07/22/18 0929 3     Pain Loc --      Pain Edu? --      Excl. in [place]? --     Constitutional: Alert and oriented. Well appearing and in no distress. Head: Normocephalic and atraumatic. Eyes: Conjunctivae are normal. Normal extraocular movements Cardiovascular: Normal rate, regular rhythm.  Respiratory: Normal respiratory effort.  GU: Normal external genitalia.  No obvious lesions are appreciated.  Vagina with a moderate thick white discharge.  Cervical office is closed.  No CMT or adnexal masses appreciated. Musculoskeletal: Nontender with normal range of motion in all extremities.  Neurologic:  Normal gait without ataxia. Normal speech and language. No gross focal neurologic deficits are appreciated. Skin:  Skin is warm, dry and intact. No rash noted. ____________________________________________  LABS (pertinent positives/negatives) Labs Reviewed  WET PREP, GENITAL - Abnormal; Notable for the following components:      Result Value   WBC, Wet Prep HPF POC FEW (*)    All other components within normal limits  URINALYSIS, COMPLETE (UACMP) WITH MICROSCOPIC - Abnormal; Notable for the following components:   Color, Urine YELLOW (*)    APPearance HAZY (*)    Ketones, ur 20 (*)    Protein, ur 30 (*)    Bacteria, UA RARE (*)    All other components within normal limits  CHLAMYDIA/NGC RT PCR  ([facility] ONLY)  POC URINE PREG, ED  POCT PREGNANCY, URINE  ____________________________________________  PROCEDURES  Procedures ____________________________________________  INITIAL IMPRESSION / ASSESSMENT AND PLAN / ED COURSE  Stephanie Faulkner was evaluated in Emergency Department on 07/22/2018 for the symptoms described in the history of present illness. She was evaluated in the context of the global COVID-19 pandemic, which necessitated consideration that the patient might be at risk for infection with the SARS-CoV-2 virus that causes COVID-19. Institutional protocols and algorithms that pertain to the evaluation of patients at risk for COVID-19 are in a state of rapid change based on information released by regulatory bodies including the CDC and federal and state organizations. These policies and algorithms were followed during the patient's care in the ED.  Patient with ED evaluation of vaginal discharge.  Patient's clinical picture is benign although possible etiologies to include STI, yeast vaginitis, BV, UTI, IUP.  Patient's labs do not confirm any acute vaginitis.  White blood cells noted which likely consistent with normal physiologic secretions.  Patient is reassured by her exam findings at this time.  She will follow-up via phone for results of her pending GC culture.  She otherwise will follow with primary provider or return to the ED as needed. ____________________________________________  FINAL CLINICAL IMPRESSION(S) / ED DIAGNOSES  Final diagnoses:  Screen for STD (sexually transmitted disease)  Vaginal discharge      Lissa HoardMenshew, Stephanie Hemrick V Bacon, PA-C 07/22/18 1244    Minna AntisPaduchowski, Kevin, MD 07/22/18 1358

## 2018-07-29 ENCOUNTER — Other Ambulatory Visit: Payer: Self-pay

## 2018-07-29 ENCOUNTER — Ambulatory Visit
Admission: EM | Admit: 2018-07-29 | Discharge: 2018-07-29 | Disposition: A | Discharge status: Home / Self Care | Payer: Medicaid Other | Attending: Urgent Care | Admitting: Urgent Care

## 2018-07-29 ENCOUNTER — Encounter: Payer: Self-pay | Admitting: Emergency Medicine

## 2018-07-29 DIAGNOSIS — B373 Candidiasis of vulva and vagina: Secondary | ICD-10-CM

## 2018-07-29 DIAGNOSIS — N760 Acute vaginitis: Secondary | ICD-10-CM | POA: Insufficient documentation

## 2018-07-29 DIAGNOSIS — B9689 Other specified bacterial agents as the cause of diseases classified elsewhere: Secondary | ICD-10-CM | POA: Diagnosis present

## 2018-07-29 DIAGNOSIS — B3731 Acute candidiasis of vulva and vagina: Secondary | ICD-10-CM

## 2018-07-29 LAB — WET PREP, GENITAL
Sperm: NONE SEEN
Trich, Wet Prep: NONE SEEN

## 2018-07-29 LAB — URINALYSIS, COMPLETE (UACMP) WITH MICROSCOPIC
Bacteria, UA: NONE SEEN
Bilirubin Urine: NEGATIVE
Glucose, UA: NEGATIVE mg/dL
Hgb urine dipstick: NEGATIVE
Nitrite: NEGATIVE
Protein, ur: NEGATIVE mg/dL
Specific Gravity, Urine: >1.03 — ABNORMAL HIGH (ref 1.005–1.030)
pH: 5 (ref 5.0–8.0)

## 2018-07-29 LAB — PREGNANCY, URINE: Preg Test, Ur: NEGATIVE

## 2018-07-29 MED ORDER — FLUCONAZOLE 150 MG PO TABS: 150.0000 mg | ORAL_TABLET | Freq: Every day | ORAL | 0 refills | Status: DC

## 2018-07-29 MED ORDER — METRONIDAZOLE 0.75 % VA GEL: 1.0000 | Freq: Every day | VAGINAL | 0 refills | Status: AC

## 2018-07-29 NOTE — ED Provider Notes (Signed)
[facility] ____________________________________________  Time seen: Approximately 2:58 PM  I have reviewed the triage vital signs and the nursing notes.   HISTORY  Chief Complaint Vaginal Discharge   HPI Stephanie Faulkner is a 33 y.o. female presenting for evaluation of vaginal itching and vaginal irritation present for the last 1 week.  States positive vaginal discharge that is white and clumpy.  Denies pelvic pain.  States some burning with urination when the urine hits the skin, denies any other urination discomfort.  Denies abdominal pain, back pain.  Denies concerns of STDs.  Continues eat and drink well.  States feels like previous yeast infection.  Reports otherwise doing well.  Denies aggravating leaving factors.  Arlyss QueenSelvidge, William M, MD: PCP Patient's last menstrual period was 07/07/2018.  Past Medical History:  Diagnosis Date  . Abortion   . Anxiety   . Depression   . History of D&C     Patient Active Problem List   Diagnosis Date Noted  . Pelvic pain 07/11/2017  . 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 CERVICAL CONE BIOPSY       No current facility-administered medications for this encounter.   Current Outpatient Medications:  .  fluconazole (DIFLUCAN) 150 MG tablet, Take 1 tablet (150 mg total) by mouth daily. Take one pill orally, then Repeat in one week as needed., Disp: 2 tablet, Rfl: 0 .  metroNIDAZOLE (METROGEL) 0.75 % vaginal gel, Place 1 Applicatorful vaginally at bedtime for 5 days. For 5 days, Disp: 70 g, Rfl: 0 .  sertraline (ZOLOFT) 100 MG tablet, Take 100 mg by mouth daily., Disp: , Rfl:   Allergies Patient has no known allergies.  Family History  Problem Relation Age of Onset  . Diabetes Mother   . Hypertension Mother   . Healthy Father     Social History Social History   Tobacco Use  . Smoking status: Never Smoker  . Smokeless tobacco: Never Used  Substance Use  Topics  . Alcohol use: Yes    Comment: social  . Drug use: Not Currently    Types: Marijuana    Comment: occasionally     Review of Systems Constitutional: No fever ENT: No sore throat. Cardiovascular: Denies chest pain. Respiratory: Denies shortness of breath. Gastrointestinal: No abdominal pain.   Genitourinary: As above Musculoskeletal: Negative for back pain. Skin: Negative for rash.  ____________________________________________   PHYSICAL EXAM:  VITAL SIGNS: ED Triage Vitals [07/29/18 1354]  Enc Vitals Group     BP 118/80     Pulse Rate 78     Resp 16     Temp 99.2 F (37.3 C)     Temp Source Oral     SpO2 100 %     Weight 154 lb 5.2 oz (70 kg)     Height 5\' 7"  (1.702 m)     Head Circumference      Peak Flow      Pain Score 0     Pain Loc      Pain Edu?      Excl. in [place]?     Constitutional: Alert and oriented. Well appearing and in no acute distress. Eyes: Conjunctivae are normal. ENT      Head: Normocephalic and atraumatic. Cardiovascular: Normal rate, regular rhythm. Grossly normal heart sounds.  Good peripheral circulation. Respiratory: Normal respiratory effort without tachypnea nor retractions. Breath sounds are clear and equal bilaterally. No wheezes, rales, rhonchi. Gastrointestinal: Soft and nontender.  No CVA tenderness. Musculoskeletal: Steady Neurologic:  Normal speech and language.  Speech is normal. No gait instability.  Skin:  Skin is warm, dry and intact. No rash noted. Psychiatric: Mood and affect are normal. Speech and behavior are normal. Patient exhibits appropriate insight and judgment   ___________________________________________   LABS (all labs ordered are listed, but only abnormal results are displayed)  Labs Reviewed  WET PREP, GENITAL - Abnormal; Notable for the following components:      Result Value   Yeast Wet Prep HPF POC PRESENT (*)    Clue Cells Wet Prep HPF POC PRESENT (*)    WBC, Wet Prep HPF POC MODERATE (*)     All other components within normal limits  URINALYSIS, COMPLETE (UACMP) WITH MICROSCOPIC - Abnormal; Notable for the following components:   Specific Gravity, Urine >1.030 (*)    Ketones, ur TRACE (*)    Leukocytes,Ua MODERATE (*)    All other components within normal limits  PREGNANCY, URINE    PROCEDURES Procedures     INITIAL IMPRESSION / ASSESSMENT AND PLAN / ED COURSE  Pertinent labs & imaging results that were available during my care of the patient were reviewed by me and considered in my medical decision making (see chart for details).  Well-appearing patient.  No acute distress.  Patient elected for self wet prep.  Wet prep positive for yeast and clue cells.  Discussed treatment options with patient.  Will treat with oral Diflucan and vaginal Flagyl.  Supportive care, pelvic rest.Discussed indication, risks and benefits of medications with patient.  Discussed follow up with Primary care physician this week. Discussed follow up and return parameters including no resolution or any worsening concerns. Patient verbalized understanding and agreed to plan.   ____________________________________________   FINAL CLINICAL IMPRESSION(S) / ED DIAGNOSES  Final diagnoses:  BV (bacterial vaginosis)  Vaginal yeast infection     ED Discharge Orders         Ordered    fluconazole (DIFLUCAN) 150 MG tablet  Daily     07/29/18 1459    metroNIDAZOLE (METROGEL) 0.75 % vaginal gel  Daily at bedtime     07/29/18 1459           Note: This dictation was prepared with Dragon dictation along with smaller phrase technology. Any transcriptional errors that result from this process are unintentional.         Marylene Land, NP 07/29/18 1544

## 2018-07-29 NOTE — Discharge Instructions (Addendum)
Take medication as prescribed. Rest. Drink plenty of fluids. Pelvic rest.  ° °Follow up with your primary care physician this week as needed. Return to Urgent care for new or worsening concerns.  ° °

## 2018-07-29 NOTE — ED Triage Notes (Signed)
Pt here with c/o white, clumpy discharge x1 week with vaginal irriation.

## 2018-08-06 IMAGING — US US TRANSVAGINAL NON-OB
1 series · 13 of 25 positions shown · non-contrast
Comparison: Pelvic ultrasound 06/13/2017 demonstrating obtained
products of conception

CLINICAL DATA: Recent D and C, pelvic pain, evaluate for retained
products of conception. Acute lower abdominal pain for 3 days after
abortion 05/23/2017.

EXAM:
TRANSABDOMINAL AND TRANSVAGINAL ULTRASOUND OF PELVIS
DOPPLER ULTRASOUND OF OVARIES
TECHNIQUE: Both transabdominal and transvaginal ultrasound examinations of the
pelvis were performed. Transabdominal technique was performed for
global imaging of the pelvis including uterus, ovaries, adnexal
regions, and pelvic cul-de-sac.
It was necessary to proceed with endovaginal exam following the
transabdominal exam to visualize the uterus and endometrium. Color
and duplex Doppler ultrasound was utilized to evaluate blood flow to
the ovaries.

[Series 1: us transvaginal non-ob · 0.21mm/px · 13 of 130 slices shown]
[im 1/130]
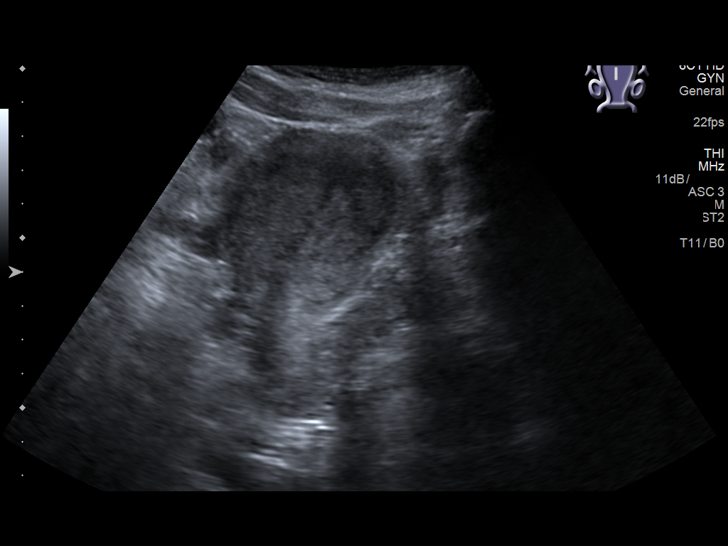
[im 11/130]
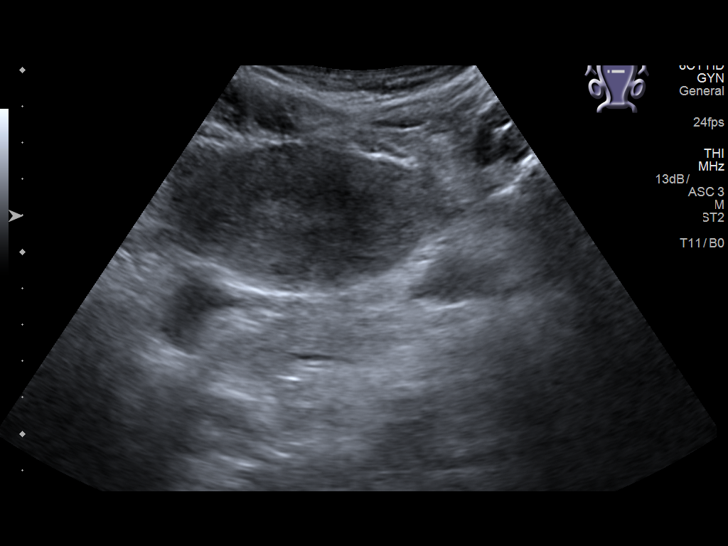
[im 22/130]
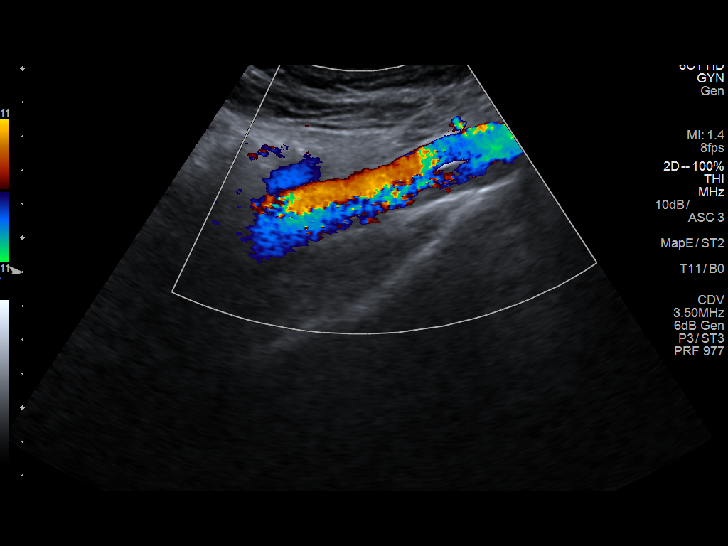
[im 33/130]
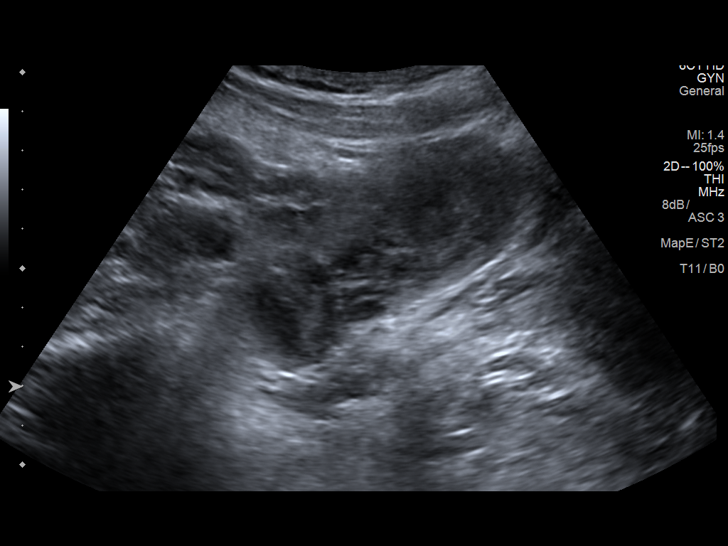
[im 44/130]
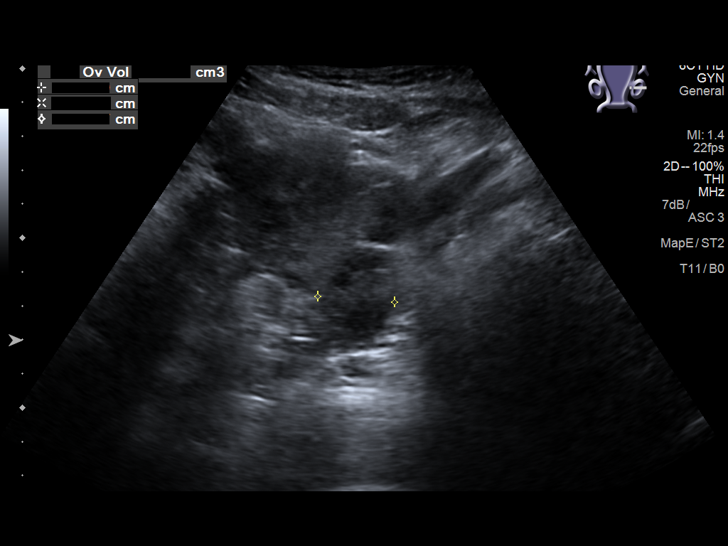
[im 54/130]
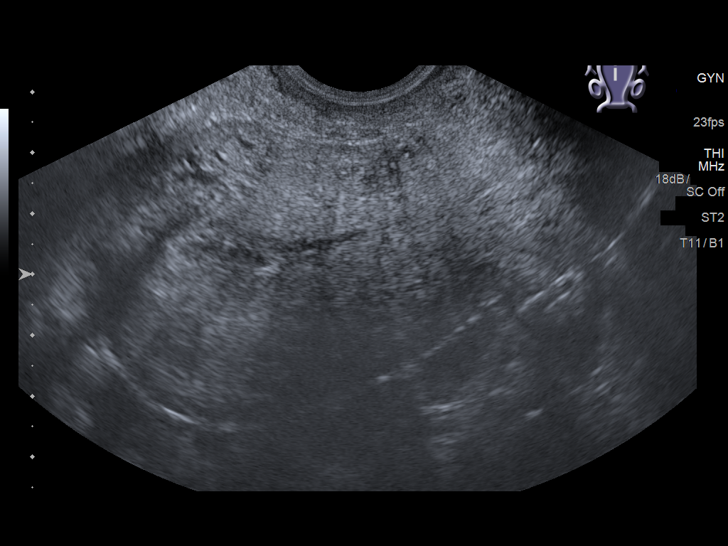
[im 65/130]
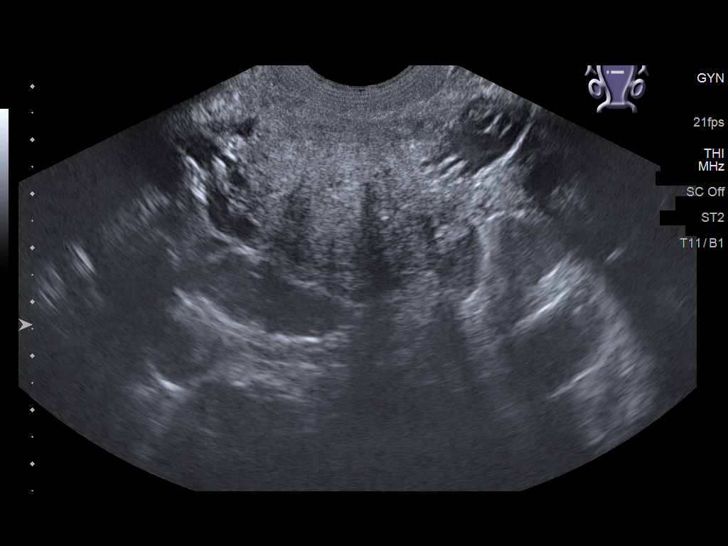
[im 76/130]
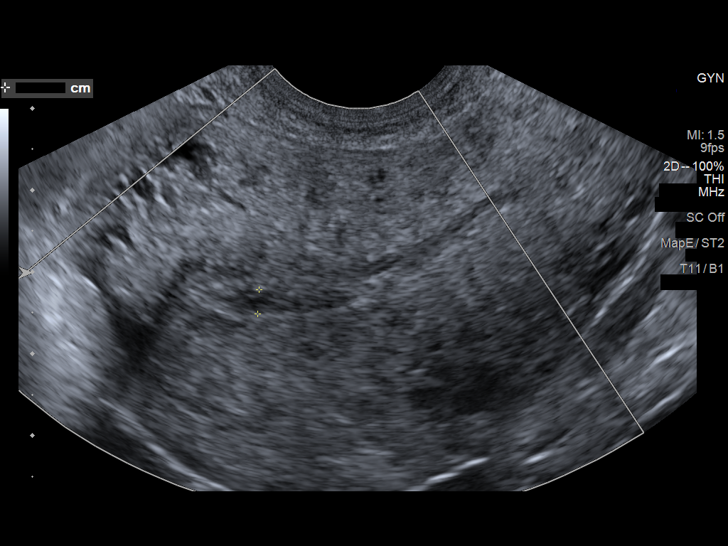
[im 87/130]
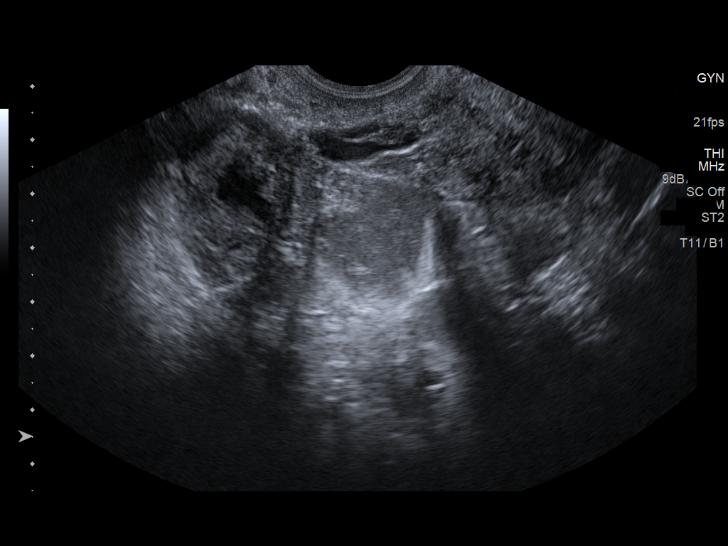
[im 97/130]
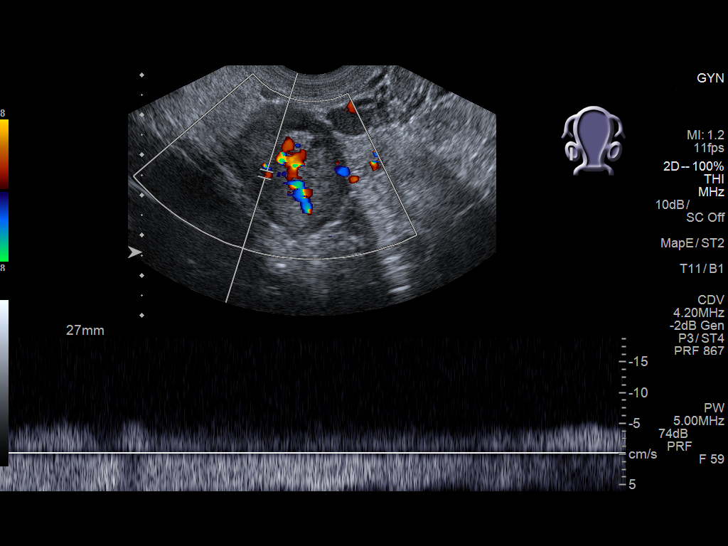
[im 108/130]
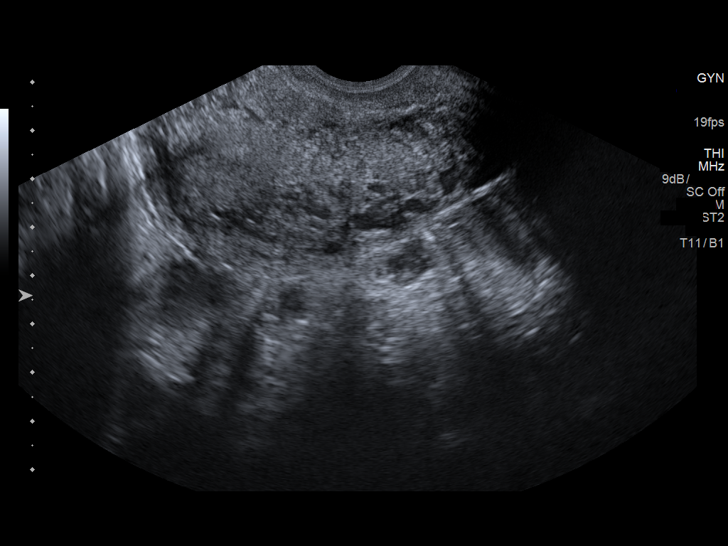
[im 119/130]
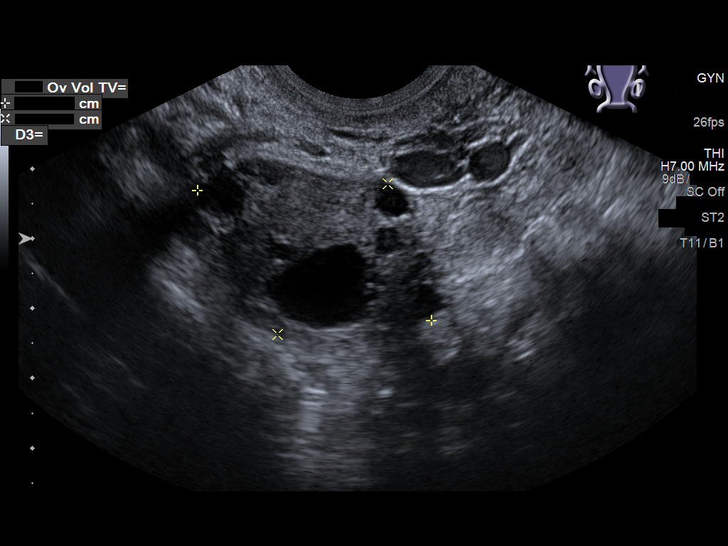
[im 130/130]
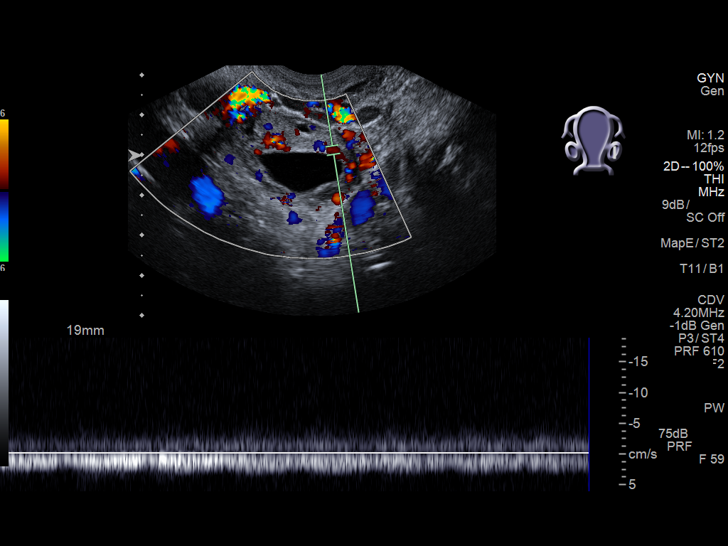

[13 of 25 positions shown; findings below may reference images not displayed]

FINDINGS: Uterus

Measurements: 9.0 x 5.0 x 6.5 cm. No fibroids or other mass
visualized.

Endometrium

Thickness: 6 mm. There is heterogeneous hypoechoic contents within
the endometrial canal. Possible endometrial vascularity superiorly.
There is increased vascularity at the junctional zone posteriorly.

Right ovary

Measurements: 4.0 x 2.7 x 2.5 cm. Complex cyst in the right ovary
measures 2.3 cm with peripheral vascularity, possible corpus luteum.
Normal blood flow.

Left ovary

Measurements: 3.8 x 2.7 x 2.5 cm. Previous complex cyst in the left
ovary is no longer seen, there is a simple cyst measuring 2.1 cm.
Normal blood flow.

Pulsed Doppler evaluation of both ovaries demonstrates normal
low-resistance arterial and venous waveforms.

Other findings

Small amount of free fluid in the pelvis.
IMPRESSION: 1. No abnormal endometrial thickening, however there is
heterogeneous hypoechoic contents in the endometrial canal with
possible endometrial vascularity and increased vascularity at the
junctional zone. Findings are suspicious for retained products of
conception. Recommend trending of beta HCG. Consider short-term
follow-up pelvic ultrasound or further evaluation with pelvic MRI
with and without IV contrast as clinically warranted.
2. Normal blood flow to both ovaries without torsion. Possible
corpus luteal cyst on the right. Prior complex cyst in the left
ovary has resolved.

## 2018-09-03 ENCOUNTER — Emergency Department
Admission: EM | Admit: 2018-09-03 | Discharge: 2018-09-03 | Disposition: A | Discharge status: Home / Self Care | Payer: 59 | Attending: Emergency Medicine | Admitting: Emergency Medicine

## 2018-09-03 ENCOUNTER — Other Ambulatory Visit: Payer: Self-pay

## 2018-09-03 DIAGNOSIS — N898 Other specified noninflammatory disorders of vagina: Secondary | ICD-10-CM | POA: Diagnosis present

## 2018-09-03 DIAGNOSIS — Z113 Encounter for screening for infections with a predominantly sexual mode of transmission: Secondary | ICD-10-CM | POA: Insufficient documentation

## 2018-09-03 DIAGNOSIS — Z79899 Other long term (current) drug therapy: Secondary | ICD-10-CM | POA: Diagnosis not present

## 2018-09-03 DIAGNOSIS — B373 Candidiasis of vulva and vagina: Secondary | ICD-10-CM | POA: Insufficient documentation

## 2018-09-03 DIAGNOSIS — B3731 Acute candidiasis of vulva and vagina: Secondary | ICD-10-CM

## 2018-09-03 LAB — URINALYSIS, COMPLETE (UACMP) WITH MICROSCOPIC
Bacteria, UA: NONE SEEN
Bilirubin Urine: NEGATIVE
Glucose, UA: NEGATIVE mg/dL
Hgb urine dipstick: NEGATIVE
Ketones, ur: NEGATIVE mg/dL
Nitrite: NEGATIVE
Protein, ur: NEGATIVE mg/dL
Specific Gravity, Urine: 1.006 (ref 1.005–1.030)
pH: 6 (ref 5.0–8.0)

## 2018-09-03 LAB — WET PREP, GENITAL
Clue Cells Wet Prep HPF POC: NONE SEEN
Sperm: NONE SEEN
Trich, Wet Prep: NONE SEEN

## 2018-09-03 LAB — POCT PREGNANCY, URINE: Preg Test, Ur: NEGATIVE

## 2018-09-03 MED ORDER — LIDOCAINE HCL (PF) 1 % IJ SOLN
INTRAMUSCULAR | Status: AC
  Administered 2018-09-03: 11:00:00 1.75 mL
  Filled 2018-09-03: qty 5

## 2018-09-03 MED ORDER — CEFTRIAXONE SODIUM 250 MG IJ SOLR
250.0000 mg | Freq: Once | INTRAMUSCULAR | Status: AC
  Administered 2018-09-03: 250 mg via INTRAMUSCULAR
  Filled 2018-09-03: qty 250

## 2018-09-03 MED ORDER — AZITHROMYCIN 500 MG PO TABS
1000.0000 mg | ORAL_TABLET | Freq: Once | ORAL | Status: AC
  Administered 2018-09-03: 11:00:00 1000 mg via ORAL
  Filled 2018-09-03: qty 2

## 2018-09-03 MED ORDER — FLUCONAZOLE 50 MG PO TABS
150.0000 mg | ORAL_TABLET | Freq: Once | ORAL | Status: AC
  Administered 2018-09-03: 150 mg via ORAL
  Filled 2018-09-03: qty 1

## 2018-09-03 NOTE — ED Notes (Signed)
Pt c/o having like a yeast infection for a while now. States her and her partner tested positive for gonnhrea and were treated and retested negative but she is concerned because "he steps out once and may have again"

## 2018-09-03 NOTE — ED Provider Notes (Signed)
[facility] Emergency Department Provider Note  ____________________________________________  Time seen: Approximately 9:02 AM  I have reviewed the triage vital signs and the nursing notes.   HISTORY  Chief Complaint Vaginal Discharge    HPI Stephanie Faulkner is a 33 y.o. female that presents to the emergency department for evaluation of white and green vaginal discharge for a couple of days.  Patient has a history of BV and yeast.  She and her partner tested positive for gonorrhea a couple of months ago, were treated, and both tested negative after.  She is concerned that her partner may have been with another partner again. This feels different than yeast infections in the past. No vaginal itching, dysuria.   Past Medical History:  Diagnosis Date  . Abortion   . Anxiety   . Depression   . History of D&C     Patient Active Problem List   Diagnosis Date Noted  . Pelvic pain 07/11/2017  . 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 CERVICAL CONE BIOPSY      Prior to Admission medications   Medication Sig Start Date End Date Taking? Authorizing Provider  fluconazole (DIFLUCAN) 150 MG tablet Take 1 tablet (150 mg total) by mouth daily. Take one pill orally, then Repeat in one week as needed. 07/29/18   Renford Dills, NP  sertraline (ZOLOFT) 100 MG tablet Take 100 mg by mouth daily.    [provider]    Allergies Patient has no known allergies.  Family History  Problem Relation Age of Onset  . Diabetes Mother   . Hypertension Mother   . Healthy Father     Social History Social History   Tobacco Use  . Smoking status: Never Smoker  . Smokeless tobacco: Never Used  Substance Use Topics  . Alcohol use: Yes    Comment: social  . Drug use: Not Currently    Types: Marijuana    Comment: occasionally      Review of Systems  Constitutional: No fever/chills Gastrointestinal: No  abdominal pain.  No nausea, no vomiting.  Genitourinary: Negative for dysuria. Musculoskeletal: Negative for musculoskeletal pain. Skin: Negative for rash, abrasions, lacerations, ecchymosis.  ____________________________________________   PHYSICAL EXAM:  VITAL SIGNS: ED Triage Vitals  Enc Vitals Group     BP 09/03/18 0821 (!) 147/91     Pulse Rate 09/03/18 0821 75     Resp 09/03/18 0821 18     Temp 09/03/18 0821 98.2 F (36.8 C)     Temp Source 09/03/18 0821 Oral     SpO2 09/03/18 0821 99 %     Weight 09/03/18 0822 147 lb (66.7 kg)     Height 09/03/18 0822 5\' 7"  (1.702 m)     Head Circumference --      Peak Flow --      Pain Score 09/03/18 0822 0     Pain Loc --      Pain Edu? --      Excl. in GC? --      Constitutional: Alert and oriented. Well appearing and in no acute distress. Eyes: Conjunctivae are normal. PERRL. EOMI. Head: Atraumatic. ENT:      Ears:      Nose: No congestion/rhinnorhea.      Mouth/Throat: Mucous membranes are moist.  Neck: No stridor.   Cardiovascular: Normal rate, regular rhythm.  Good peripheral circulation. Respiratory: Normal respiratory effort without tachypnea or retractions. Lungs CTAB. Good air entry  to the bases with no decreased or absent breath sounds. Gastrointestinal: Bowel sounds 4 quadrants. Soft and nontender to palpation. No guarding or rigidity. No palpable masses. No distention.  Genitourinary: No external rashes or lesions.  Yellow clumping vaginal discharge.  No cervical motion tenderness. Musculoskeletal: Full range of motion to all extremities. No gross deformities appreciated. Neurologic:  Normal speech and language. No gross focal neurologic deficits are appreciated.  Skin:  Skin is warm, dry and intact. No rash noted. Psychiatric: Mood and affect are normal. Speech and behavior are normal. Patient exhibits appropriate insight and judgement.   ____________________________________________   LABS (all labs ordered  are listed, but only abnormal results are displayed)  Labs Reviewed  WET PREP, GENITAL - Abnormal; Notable for the following components:      Result Value   Yeast Wet Prep HPF POC PRESENT (*)    WBC, Wet Prep HPF POC MANY (*)    All other components within normal limits  URINALYSIS, COMPLETE (UACMP) WITH MICROSCOPIC - Abnormal; Notable for the following components:   Color, Urine STRAW (*)    APPearance HAZY (*)    Leukocytes,Ua LARGE (*)    All other components within normal limits  GC/CHLAMYDIA PROBE AMP  POC URINE PREG, ED  POCT PREGNANCY, URINE   ____________________________________________  EKG   ____________________________________________  RADIOLOGY  No results found.  ____________________________________________    PROCEDURES  Procedure(s) performed:    Procedures    Medications  fluconazole (DIFLUCAN) tablet 150 mg (150 mg Oral Given 09/03/18 1101)  cefTRIAXone (ROCEPHIN) injection 250 mg (250 mg Intramuscular Given 09/03/18 1103)  azithromycin (ZITHROMAX) tablet 1,000 mg (1,000 mg Oral Given 09/03/18 1101)  lidocaine (PF) (XYLOCAINE) 1 % injection (1.75 mLs  Given 09/03/18 1105)     ____________________________________________   INITIAL IMPRESSION / ASSESSMENT AND PLAN / ED COURSE  Pertinent labs & imaging results that were available during my care of the patient were reviewed by me and considered in my medical decision making (see chart for details).  Review of the [place] CSRS was performed in accordance of the [place] prior to dispensing any controlled drugs.     Patient's diagnosis is consistent with yeast vaginitis.  Vital signs and exam are reassuring.  Wet prep tested positive for yeast.  Patient was treated empirically for gonorrhea and chlamydia with ceftriaxone and azithromycin.  Patient was given a dose of Diflucan for yeast infection.   Patient is to follow up with [facility] gynecology as directed. Patient is given ED precautions to return to the ED for any  worsening or new symptoms.  Stephanie Faulkner was evaluated in Emergency Department on 09/03/2018 for the symptoms described in the history of present illness. She was evaluated in the context of the global COVID-19 pandemic, which necessitated consideration that the patient might be at risk for infection with the SARS-CoV-2 virus that causes COVID-19. Institutional protocols and algorithms that pertain to the evaluation of patients at risk for COVID-19 are in a state of rapid change based on information released by regulatory bodies including the CDC and federal and state organizations. These policies and algorithms were followed during the patient's care in the ED.   ____________________________________________  FINAL CLINICAL IMPRESSION(S) / ED DIAGNOSES  Final diagnoses:  Vaginal discharge  Vaginal yeast infection      NEW MEDICATIONS STARTED DURING THIS VISIT:  ED Discharge Orders    None          This chart was dictated using voice recognition software/Dragon. Despite  best efforts to proofread, errors can occur which can change the meaning. Any change was purely unintentional.    Enid DerryWagner, Brodrick Curran, PA-C 09/03/18 1304    Emily FilbertWilliams, Jonathan E, MD 09/03/18 1314

## 2018-09-03 NOTE — Discharge Instructions (Addendum)
Your STD results will be ready in about 2 days.

## 2018-09-03 NOTE — ED Triage Notes (Signed)
White/pink vaginal discharge X 2 days, itching. Reports started after menstrual period. Hx of yeast infection frequently. Pt alert and oriented X4, cooperative, RR even and unlabored, color WNL. Pt in NAD. Unprotected sex.

## 2018-09-07 LAB — GC/CHLAMYDIA PROBE AMP
Chlamydia trachomatis, NAA: NEGATIVE
Neisseria Gonorrhoeae by PCR: NEGATIVE

## 2018-12-21 ENCOUNTER — Encounter: Payer: Self-pay | Admitting: Emergency Medicine

## 2018-12-21 ENCOUNTER — Emergency Department
Admission: EM | Admit: 2018-12-21 | Discharge: 2018-12-21 | Disposition: A | Discharge status: Home / Self Care | Payer: Private Health Insurance - Indemnity | Attending: Emergency Medicine | Admitting: Emergency Medicine

## 2018-12-21 ENCOUNTER — Other Ambulatory Visit: Payer: Self-pay

## 2018-12-21 DIAGNOSIS — Z202 Contact with and (suspected) exposure to infections with a predominantly sexual mode of transmission: Secondary | ICD-10-CM

## 2018-12-21 DIAGNOSIS — A599 Trichomoniasis, unspecified: Secondary | ICD-10-CM | POA: Diagnosis not present

## 2018-12-21 LAB — URINALYSIS, COMPLETE (UACMP) WITH MICROSCOPIC
Bilirubin Urine: NEGATIVE
Glucose, UA: NEGATIVE mg/dL
Hgb urine dipstick: NEGATIVE
Ketones, ur: NEGATIVE mg/dL
Nitrite: NEGATIVE
Protein, ur: NEGATIVE mg/dL
Specific Gravity, Urine: 1.012 (ref 1.005–1.030)
pH: 6 (ref 5.0–8.0)

## 2018-12-21 LAB — WET PREP, GENITAL
Clue Cells Wet Prep HPF POC: NONE SEEN
Sperm: NONE SEEN
Trich, Wet Prep: NONE SEEN

## 2018-12-21 LAB — POC URINE PREG, ED: Preg Test, Ur: NEGATIVE

## 2018-12-21 MED ORDER — CEFTRIAXONE SODIUM 250 MG IJ SOLR
250.0000 mg | Freq: Once | INTRAMUSCULAR | Status: AC
  Administered 2018-12-21: 250 mg via INTRAMUSCULAR
  Filled 2018-12-21: qty 250

## 2018-12-21 MED ORDER — TINIDAZOLE 500 MG PO TABS: 2.0000 g | ORAL_TABLET | Freq: Every day | ORAL | 0 refills | Status: AC

## 2018-12-21 MED ORDER — AZITHROMYCIN 500 MG PO TABS
1000.0000 mg | ORAL_TABLET | Freq: Once | ORAL | Status: AC
  Administered 2018-12-21: 1000 mg via ORAL
  Filled 2018-12-21: qty 2

## 2018-12-21 MED ORDER — LIDOCAINE HCL (PF) 1 % IJ SOLN
INTRAMUSCULAR | Status: AC
  Administered 2018-12-21: 0.9 mL via INTRADERMAL
  Filled 2018-12-21: qty 5

## 2018-12-21 MED ORDER — TINIDAZOLE 500 MG PO TABS: 2.0000 g | ORAL_TABLET | Freq: Every day | ORAL | 0 refills | Status: DC

## 2018-12-21 MED ORDER — METRONIDAZOLE 0.75 % VA GEL: 1.0000 | Freq: Two times a day (BID) | VAGINAL | 0 refills | Status: AC

## 2018-12-21 MED ORDER — FLUCONAZOLE 150 MG PO TABS: ORAL_TABLET | ORAL | 0 refills | Status: AC

## 2018-12-21 MED ORDER — LIDOCAINE HCL (PF) 1 % IJ SOLN: 0.9000 mL | Freq: Once | INTRAMUSCULAR | Status: AC

## 2018-12-21 NOTE — ED Notes (Signed)
See triage note: pt exposed to STD

## 2018-12-21 NOTE — ED Triage Notes (Signed)
Pt exposed to STD and would like to be checked.

## 2018-12-21 NOTE — Discharge Instructions (Addendum)
Use tinidazole 500 mg 2 pills 2x daily for 7 days, Buy boric acid vaginal suppositories, 600mg  vaginally twice daily for 28 days, once clear use metrogel

## 2018-12-21 NOTE — ED Provider Notes (Signed)
[facility] Emergency Department Provider Note  ____________________________________________   First MD Initiated Contact with Patient 12/21/18 1502     (approximate)  I have reviewed the triage vital signs and the nursing notes.   HISTORY  Chief Complaint Exposure to STD    HPI Stephanie Faulkner is a 33 y.o. female presents emergency department complaining of recurrent trichomoniasis.  She states that her husband "stepped out on her "and she developed a trichomoniasis infection.  They had both been treated with a single dose metronidazole.  Then with the recurrence they were both treated with  tinidazole x1 day treatment.  Pt states she has the same sx again.  Denies fever/chills, or abd pain   Past Medical History:  Diagnosis Date  . Abortion   . Anxiety   . Depression   . History of D&C     Patient Active Problem List   Diagnosis Date Noted  . Pelvic pain 07/11/2017  . 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 CERVICAL CONE BIOPSY      Prior to Admission medications   Medication Sig Start Date End Date Taking? Authorizing Provider  fluconazole (DIFLUCAN) 150 MG tablet Take one now and one in a week 12/21/18   Sherrie MustacheFisher, Roselyn BeringSusan W, PA-C  metroNIDAZOLE (METROGEL VAGINAL) 0.75 % vaginal gel Place 1 Applicatorful vaginally 2 (two) times daily. 12/21/18   Sherrie MustacheFisher, Roselyn BeringSusan W, PA-C  sertraline (ZOLOFT) 100 MG tablet Take 100 mg by mouth daily.    [provider]  tinidazole (TINDAMAX) 500 MG tablet Take 4 tablets (2,000 mg total) by mouth daily with breakfast. 12/21/18   Sherrie MustacheFisher, Roselyn BeringSusan W, PA-C    Allergies Patient has no known allergies.  Family History  Problem Relation Age of Onset  . Diabetes Mother   . Hypertension Mother   . Healthy Father     Social History Social History   Tobacco Use  . Smoking status: Never Smoker  . Smokeless tobacco: Never Used  Substance Use Topics  .  Alcohol use: Yes    Comment: social  . Drug use: Not Currently    Types: Marijuana    Comment: occasionally     Review of Systems  Constitutional: No fever/chills Eyes: No visual changes. ENT: No sore throat. Respiratory: Denies cough Genitourinary: Negative for dysuria.  Positive vaginal discharge and recurrent trichomoniasis infection Musculoskeletal: Negative for back pain. Skin: Negative for rash.    ____________________________________________   PHYSICAL EXAM:  VITAL SIGNS: ED Triage Vitals  Enc Vitals Group     BP 12/21/18 1447 122/82     Pulse Rate 12/21/18 1447 71     Resp 12/21/18 1447 18     Temp 12/21/18 1447 99.1 F (37.3 C)     Temp Source 12/21/18 1447 Oral     SpO2 12/21/18 1447 98 %     Weight 12/21/18 1442 148 lb (67.1 kg)     Height 12/21/18 1442 5\' 7"  (1.702 m)     Head Circumference --      Peak Flow --      Pain Score 12/21/18 1442 0     Pain Loc --      Pain Edu? --      Excl. in GC? --     Constitutional: Alert and oriented. Well appearing and in no acute distress. Eyes: Conjunctivae are normal.  Head: Atraumatic. Nose: No congestion/rhinnorhea. Mouth/Throat: Mucous membranes are moist.   Neck:  supple no  lymphadenopathy noted Cardiovascular: Normal rate, regular rhythm.  Respiratory: Normal respiratory effort.  No retractions,  Abd: soft nontender bs normal all 4 quad GU: No external lesions noted, no copious discharge noted outside the vaginal vault, speculum exam shows very irritated tissue in the vaginal vault, thick greenish-white discharge noted, no cervical motion tenderness Musculoskeletal: FROM all extremities, warm and well perfused Neurologic:  Normal speech and language.  Skin:  Skin is warm, dry and intact. No rash noted. Psychiatric: Mood and affect are normal. Speech and behavior are normal.  ____________________________________________   LABS (all labs ordered are listed, but only abnormal results are  displayed)  Labs Reviewed  WET PREP, GENITAL - Abnormal; Notable for the following components:      Result Value   Yeast Wet Prep HPF POC PRESENT (*)    WBC, Wet Prep HPF POC TOO NUMEROUS TO COUNT (*)    All other components within normal limits  URINALYSIS, COMPLETE (UACMP) WITH MICROSCOPIC - Abnormal; Notable for the following components:   Color, Urine STRAW (*)    APPearance CLEAR (*)    Leukocytes,Ua MODERATE (*)    Bacteria, UA RARE (*)    All other components within normal limits  GC/CHLAMYDIA PROBE AMP  POC URINE PREG, ED   ____________________________________________   ____________________________________________  RADIOLOGY    ____________________________________________   PROCEDURES  Procedure(s) performed: No  Procedures    ____________________________________________   INITIAL IMPRESSION / ASSESSMENT AND PLAN / ED COURSE  Pertinent labs & imaging results that were available during my care of the patient were reviewed by me and considered in my medical decision making (see chart for details).   Patient is 33 year old female presents emergency department with complaints of trichomoniasis.  She states this is been a recurrent problem.  Continues to have discharge and feels it is the same as when she was originally diagnosed with trichomoniasis.  She denies fever chills.  No abdominal pain.  Physical exam shows patient to appear well.  There is a thick white chunky/green discharge noted in the vaginal area.  Wet prep shows yeast and numerous WBCs. UA is showing moderate leuks STD testing for GC/chlamydia is still pending  Explained all the findings to the patient.  Due to her partner not being tested for STDs just being treated for trichomoniasis, I will order the GC/chlamydia test and still empirically treat her here today.  Due to this being recurrent trichomoniasis, I will also instruct her to take tinidazole and use boric acid suppositories.  If her STD  testing is positive her partner will also need to be treated for this.  She states she understands will comply.  I encouraged her to follow-up with the health department as they have more expertise in treating STDs and anyone else in the county.  She states she understands will comply.  We did discuss that she should not douche or use any products to clean the vaginal area as this will disrupt her treatment.    Stephanie Faulkner was evaluated in Emergency Department on 12/21/2018 for the symptoms described in the history of present illness. She was evaluated in the context of the global COVID-19 pandemic, which necessitated consideration that the patient might be at risk for infection with the SARS-CoV-2 virus that causes COVID-19. Institutional protocols and algorithms that pertain to the evaluation of patients at risk for COVID-19 are in a state of rapid change based on information released by regulatory bodies including the CDC and federal and state organizations. These  policies and algorithms were followed during the patient's care in the ED.   As part of my medical decision making, I reviewed the following data within the [place] notes reviewed and incorporated, Labs reviewed , Old chart reviewed, Notes from prior ED visits and [place] Controlled Substance Database  ____________________________________________   FINAL CLINICAL IMPRESSION(S) / ED DIAGNOSES  Final diagnoses:  STD exposure  Trichomoniasis      NEW MEDICATIONS STARTED DURING THIS VISIT:  Discharge Medication List as of 12/21/2018  4:24 PM    START taking these medications   Details  metroNIDAZOLE (METROGEL VAGINAL) 0.75 % vaginal gel Place 1 Applicatorful vaginally 2 (two) times daily., Starting Sun 12/21/2018, Normal         Note:  This document was prepared using Dragon voice recognition software and may include unintentional dictation errors.    Versie Starks, PA-C 12/21/18 1643     Carrie Mew, MD 12/21/18 2325

## 2019-01-05 DIAGNOSIS — N871 Moderate cervical dysplasia: Secondary | ICD-10-CM | POA: Insufficient documentation

## 2019-01-05 DIAGNOSIS — F419 Anxiety disorder, unspecified: Secondary | ICD-10-CM | POA: Insufficient documentation

## 2019-01-06 ENCOUNTER — Ambulatory Visit: Payer: Medicaid Other

## 2020-06-01 IMAGING — CR DG CHEST 2V
2 series · 2 of 2 positions shown · non-contrast
Comparison: 08/20/2017

CLINICAL DATA: Left chest pain

EXAM:
CHEST - 2 VIEW

[chest pa]
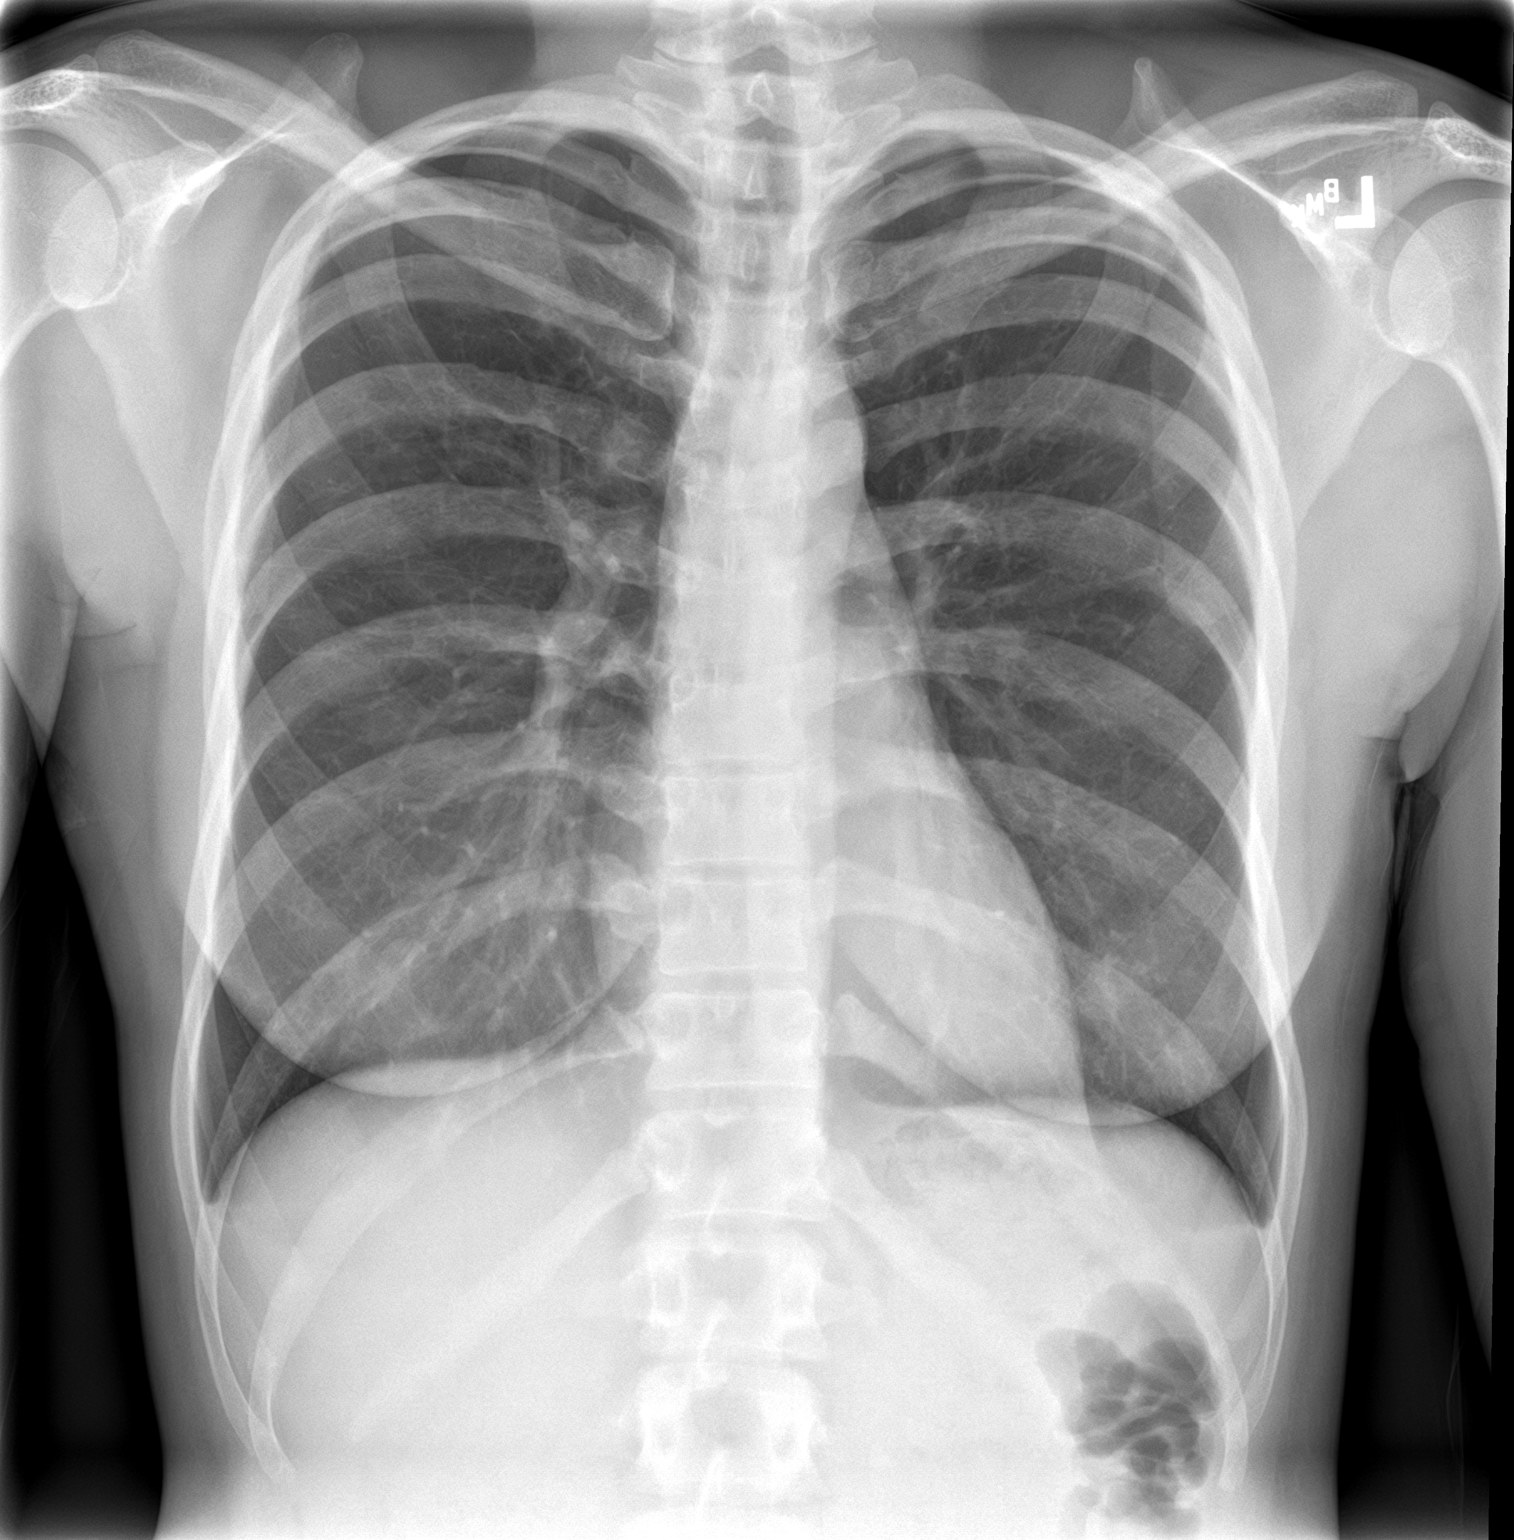

[chest lat]
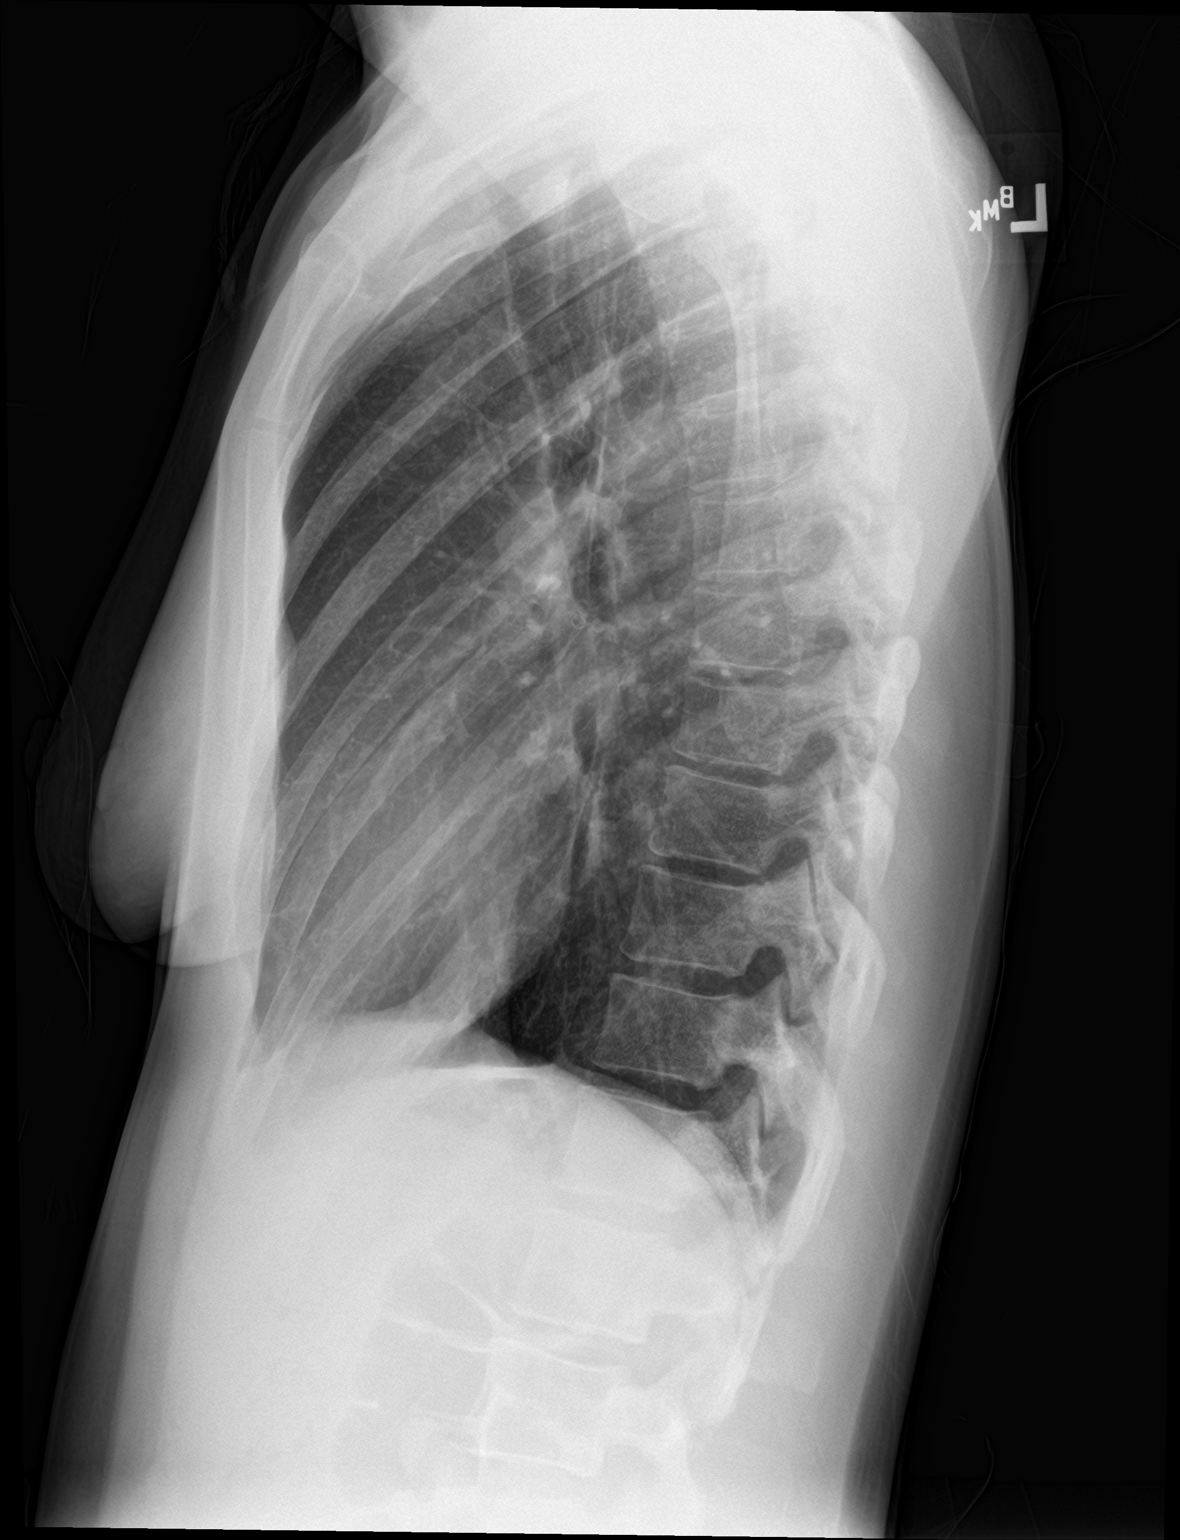

[2 of 2 positions shown; findings below may reference images not displayed]

FINDINGS: Heart and mediastinal contours are within normal limits. No focal
opacities or effusions. No acute bony abnormality.
IMPRESSION: No active cardiopulmonary disease.

## 2021-10-25 LAB — GC/CHLAMYDIA PROBE AMP
Chlamydia trachomatis, NAA: NEGATIVE
Neisseria Gonorrhoeae by PCR: NEGATIVE

## 2022-05-11 ENCOUNTER — Encounter: Attending: Cardiovascular Disease | Primary: Family Medicine

## 2022-05-11 NOTE — Progress Notes (Deleted)
ZOXWRUE R Canipe (DOB:  07/21/1985) is a 37 y.o. female,Established patient, here for evaluation of the following chief complaint(s):  No chief complaint on file.    Subjective   SUBJECTIVE/OBJECTIVE:  HPI  Patient presents today as a new patient evaluation. She is 37 years of age. She has a history of preeclampsia and subsequent hypertension that developed during the latter part of her pregnancy last year. She delivered in September 2022. She has had issues with blood pressure elevation since then and has been on several medications but is currently not taking anything, although she is assigned to have hydrochlorothiazide and labetalol. She was seen in [place], [place] at the time but has relocated here. No chest discomfort noted. No shortness of breath, but does note some palpitations periodically. No syncope. No orthopnea. She is a nonsmoker. No history of known coronary disease or systolic heart failure.           I have carefully reviewed all available medical records, previous office notes, labs, x-rays, and procedure reports.    No past medical history on file.     No past surgical history on file.     Not on File     No current outpatient medications on file.     No current facility-administered medications for this visit.              No family history on file.     Review of Systems   Constitutional: Negative.    HENT: Negative.     Eyes: Negative.    Respiratory:  Negative for shortness of breath.    Cardiovascular:  Negative for chest pain and palpitations.   Gastrointestinal: Negative.    Endocrine: Negative.    Genitourinary: Negative.    Allergic/Immunologic: Negative.    Neurological: Negative.    Hematological: Negative.    Psychiatric/Behavioral: Negative.       Physical Exam  Vitals and nursing note reviewed.   Constitutional:       Appearance: Normal appearance.   HENT:      Head: Normocephalic and atraumatic.      Right Ear: External ear normal.      Left Ear: External ear normal.      Nose:  Nose normal.      Mouth/Throat:      Mouth: Mucous membranes are dry.      Pharynx: Oropharynx is clear.   Eyes:      Extraocular Movements: Extraocular movements intact.      Conjunctiva/sclera: Conjunctivae normal.      Pupils: Pupils are equal, round, and reactive to light.   Neck:      Thyroid: No thyroid mass.      Vascular: No carotid bruit or JVD.      Trachea: Trachea normal.   Cardiovascular:      Rate and Rhythm: Normal rate and regular rhythm.      Pulses:           Carotid pulses are 2+ on the right side and 2+ on the left side.       Radial pulses are 2+ on the right side and 2+ on the left side.        Femoral pulses are 2+ on the right side and 2+ on the left side.       Popliteal pulses are 2+ on the right side and 2+ on the left side.        Dorsalis pedis pulses are 2+ on the right  side and 2+ on the left side.        Posterior tibial pulses are 2+ on the right side and 2+ on the left side.      Heart sounds: S1 normal and S2 normal. Murmur heard.      Decrescendo systolic murmur is present with a grade of 1/6.      Gallop present. S4 sounds present.   Pulmonary:      Effort: Pulmonary effort is normal.      Breath sounds: Normal breath sounds.   Abdominal:      General: Abdomen is flat. Bowel sounds are normal.      Palpations: Abdomen is soft.   Musculoskeletal:         General: Normal range of motion.      Cervical back: Normal range of motion and neck supple.      Right lower leg: No edema.      Left lower leg: No edema.   Lymphadenopathy:      Cervical: No cervical adenopathy.   Skin:     General: Skin is warm and dry.      Capillary Refill: Capillary refill takes 2 to 3 seconds.   Neurological:      General: No focal deficit present.      Mental Status: She is alert and oriented to person, place, and time.   Psychiatric:         Mood and Affect: Mood normal.       ASSESSMENT/PLAN:  1. Primary hypertension  2. Palpitations      No results found for any visits on 05/11/22.     No follow-ups on  file.        {Time Documentation Optional:[id number]}      An electronic signature was used to authenticate this note.    --Gurney Maxin, MD
